# Patient Record
Sex: Female | Born: 1975 | Race: White | Hispanic: No | Smoking: Never smoker
Health system: Southern US, Community
[De-identification: ages and names within clinical notes are randomized; demographics above are authoritative.]

## PROBLEM LIST (undated history)

## (undated) DIAGNOSIS — R112 Nausea with vomiting, unspecified: Secondary | ICD-10-CM

## (undated) DIAGNOSIS — K589 Irritable bowel syndrome without diarrhea: Secondary | ICD-10-CM

## (undated) DIAGNOSIS — K219 Gastro-esophageal reflux disease without esophagitis: Secondary | ICD-10-CM

## (undated) DIAGNOSIS — R519 Headache, unspecified: Secondary | ICD-10-CM

## (undated) DIAGNOSIS — Z9889 Other specified postprocedural states: Secondary | ICD-10-CM

## (undated) DIAGNOSIS — R0789 Other chest pain: Secondary | ICD-10-CM

## (undated) DIAGNOSIS — R002 Palpitations: Secondary | ICD-10-CM

## (undated) DIAGNOSIS — E0590 Thyrotoxicosis, unspecified without thyrotoxic crisis or storm: Secondary | ICD-10-CM

## (undated) DIAGNOSIS — D6851 Activated protein C resistance: Secondary | ICD-10-CM

## (undated) DIAGNOSIS — L719 Rosacea, unspecified: Secondary | ICD-10-CM

## (undated) DIAGNOSIS — D219 Benign neoplasm of connective and other soft tissue, unspecified: Secondary | ICD-10-CM

## (undated) DIAGNOSIS — R51 Headache: Secondary | ICD-10-CM

## (undated) HISTORY — PX: WISDOM TOOTH EXTRACTION: SHX21

## (undated) HISTORY — PX: CHOLECYSTECTOMY: SHX55

## (undated) HISTORY — DX: Palpitations: R00.2

## (undated) HISTORY — DX: Other chest pain: R07.89

---

## 2011-12-23 HISTORY — PX: OTHER SURGICAL HISTORY: SHX169

## 2012-07-28 DIAGNOSIS — J019 Acute sinusitis, unspecified: Secondary | ICD-10-CM | POA: Insufficient documentation

## 2012-07-28 DIAGNOSIS — H6120 Impacted cerumen, unspecified ear: Secondary | ICD-10-CM | POA: Insufficient documentation

## 2012-07-28 DIAGNOSIS — H6990 Unspecified Eustachian tube disorder, unspecified ear: Secondary | ICD-10-CM | POA: Insufficient documentation

## 2012-11-14 DIAGNOSIS — E042 Nontoxic multinodular goiter: Secondary | ICD-10-CM | POA: Insufficient documentation

## 2013-02-05 ENCOUNTER — Other Ambulatory Visit: Payer: Self-pay | Admitting: Endocrinology

## 2013-02-05 DIAGNOSIS — E041 Nontoxic single thyroid nodule: Secondary | ICD-10-CM

## 2013-02-20 ENCOUNTER — Inpatient Hospital Stay
Admission: RE | Admit: 2013-02-20 | Discharge: 2013-02-20 | Disposition: A | Discharge status: Home / Self Care | Payer: Self-pay | Source: Ambulatory Visit | Attending: Endocrinology | Admitting: Endocrinology

## 2013-02-20 ENCOUNTER — Ambulatory Visit
Admission: RE | Admit: 2013-02-20 | Discharge: 2013-02-20 | Disposition: A | Discharge status: Home / Self Care | Payer: BC Managed Care – PPO | Source: Ambulatory Visit | Attending: Endocrinology | Admitting: Endocrinology

## 2013-02-20 ENCOUNTER — Other Ambulatory Visit (HOSPITAL_COMMUNITY)
Admission: RE | Admit: 2013-02-20 | Discharge: 2013-02-20 | Disposition: A | Discharge status: Home / Self Care | Payer: BC Managed Care – PPO | Source: Ambulatory Visit | Attending: Interventional Radiology | Admitting: Interventional Radiology

## 2013-02-20 DIAGNOSIS — E041 Nontoxic single thyroid nodule: Secondary | ICD-10-CM

## 2013-07-02 DIAGNOSIS — R109 Unspecified abdominal pain: Secondary | ICD-10-CM | POA: Insufficient documentation

## 2013-12-24 DIAGNOSIS — R0981 Nasal congestion: Secondary | ICD-10-CM | POA: Insufficient documentation

## 2013-12-24 DIAGNOSIS — N912 Amenorrhea, unspecified: Secondary | ICD-10-CM | POA: Insufficient documentation

## 2014-02-04 DIAGNOSIS — F5101 Primary insomnia: Secondary | ICD-10-CM | POA: Insufficient documentation

## 2014-02-04 DIAGNOSIS — F419 Anxiety disorder, unspecified: Secondary | ICD-10-CM | POA: Insufficient documentation

## 2014-02-04 DIAGNOSIS — G4700 Insomnia, unspecified: Secondary | ICD-10-CM | POA: Insufficient documentation

## 2014-08-20 DIAGNOSIS — R1012 Left upper quadrant pain: Secondary | ICD-10-CM | POA: Insufficient documentation

## 2014-08-20 DIAGNOSIS — K580 Irritable bowel syndrome with diarrhea: Secondary | ICD-10-CM | POA: Insufficient documentation

## 2014-08-20 DIAGNOSIS — K5900 Constipation, unspecified: Secondary | ICD-10-CM | POA: Insufficient documentation

## 2015-03-12 DIAGNOSIS — R1084 Generalized abdominal pain: Secondary | ICD-10-CM | POA: Insufficient documentation

## 2015-03-12 DIAGNOSIS — R1031 Right lower quadrant pain: Secondary | ICD-10-CM | POA: Insufficient documentation

## 2015-07-26 DIAGNOSIS — G8929 Other chronic pain: Secondary | ICD-10-CM | POA: Insufficient documentation

## 2015-08-06 DIAGNOSIS — N921 Excessive and frequent menstruation with irregular cycle: Secondary | ICD-10-CM | POA: Insufficient documentation

## 2015-08-13 DIAGNOSIS — D6851 Activated protein C resistance: Secondary | ICD-10-CM | POA: Insufficient documentation

## 2016-02-25 DIAGNOSIS — K219 Gastro-esophageal reflux disease without esophagitis: Secondary | ICD-10-CM | POA: Insufficient documentation

## 2016-03-02 DIAGNOSIS — K824 Cholesterolosis of gallbladder: Secondary | ICD-10-CM | POA: Insufficient documentation

## 2016-03-04 DIAGNOSIS — F411 Generalized anxiety disorder: Secondary | ICD-10-CM | POA: Insufficient documentation

## 2016-04-25 DIAGNOSIS — Z823 Family history of stroke: Secondary | ICD-10-CM | POA: Insufficient documentation

## 2016-07-01 DIAGNOSIS — R202 Paresthesia of skin: Secondary | ICD-10-CM | POA: Insufficient documentation

## 2016-08-21 HISTORY — PX: OTHER SURGICAL HISTORY: SHX169

## 2016-08-25 ENCOUNTER — Other Ambulatory Visit: Payer: Self-pay | Admitting: Obstetrics and Gynecology

## 2016-08-31 DIAGNOSIS — G43009 Migraine without aura, not intractable, without status migrainosus: Secondary | ICD-10-CM | POA: Insufficient documentation

## 2016-08-31 DIAGNOSIS — G43909 Migraine, unspecified, not intractable, without status migrainosus: Secondary | ICD-10-CM | POA: Insufficient documentation

## 2016-08-31 NOTE — H&P (Signed)
Alexandra Hinton is a 41 y.o.  female P: 0-0-1-0 who presents for myomectomy because of symptomatic uterine fibroids.  The patient underwent a myomectomy in 2013 with no fibroid related symptoms following until 2016 when her symptoms returned.  She reports a 5 day menstrual flow that requires the change of a 3 pads every 20 minutes to 1 pad every hour..  As her period tapers, she may be able to go for 2 hours without a pad change.   Additionally she has some inter-menstrual spotting, premenstural diarrhea, post coital bleeding and positional dyspareunia.  She denies any changes in urination , though she finds it difficult to hold her urine, and no dysuria or incontinence.  For 2 weeks out of each month the patient describes problems with having a bowel movement though it is not constipation. Taking stool softeners seems to help these symptoms. She will have, throughout the month, periodic right lower quadrant abdominal pain that is described as dull and achy with episodes of sharp shooting pains.  A pelvic ultrasound in May 2018 revealed: retroverted uterus: 4.93 x 5.22 x 9.17 cm,   endometrium: 1.03 cm; #5 fibroids: right inferior subserosal-2.74 cm;   right lateral inferior lower uterine segment subserosal-2.44;   anterior inferior lower unterine segment subserosal-2.65 cm; posterior left subserosal-5.70 cm and posterior fundal intramural-2.86 cm; right ovary-3.48 cm and left ovary-3.85 cm.  A review of both medical and surgical management options were given to the patient, however, she has decided to proceed with surgical management in the form of myomectomy.   Past Medical History  OB History: G: 1  P: 0-0-1-0  GYN History: menarche: 41 YO    LMP: 08/07/16    Contracepton no method  The patient denies history of sexually transmitted disease.  Has a history of abnormal PAP smear treated with Cervical Conization-normal since.  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 Amoxicillin-Pot Clavulanate Hives and Shortness Of Breath    Has patient had a PCN reaction causing immediate rash, facial/tongue/throat swelling, SOB or lightheadedness with hypotension: Yes Has patient had a PCN reaction causing severe rash involving mucus membranes or skin necrosis: Yes Has patient had a PCN reaction that required hospitalization: No Has patient had a PCN reaction occurring within the last 10 years: No If all of the above answers are "NO", then may proceed with Cephalosporin use.   . Cefaclor Anaphylaxis, Shortness Of Breath and Rash  . Sulfamethoxazole-Trimethoprim Hives and Shortness Of Breath  . Tape Rash    derma bond allergy  PATIENT TOLERATES PAPER TAPER   . Erythromycin Diarrhea and Nausea And Vomiting  . Latex Rash    Intolerant of Percocet (severe)  but able to tolerate Vicodin  Denies sensitivity to peanuts,  shellfish, soy or  latex  but has severe skin reaction to Dermabond and some adhesives   ROS: Admits to stress headaches, situational anxiety, occasional blurred vision, nausea and diarrhea with menses and urinary  frequency on occasion;  Denies corrective lenses,  nasal congestion, dysphagia, tinnitus, dizziness, hoarseness, cough,  chest pain, shortness of breath,  vomiting, constipation,   urgency  dysuria, hematuria, vaginitis symptoms, swelling of joints,easy bruising,  myalgias, arthralgias, skin rashes, unexplained weight loss and except as is mentioned in the history of present illness, patient's review of systems is otherwise negative.   Physical Exam  Bp: 100/60    P: 76 bpm   R: 16  Temperature: 99.4 degrees F orally   Weight: 156 lbs.  Height: 5'5"  BMI:  26  Neck: supple without masses or thyromegaly Lungs: clear to auscultation Heart: regular rate and rhythm Abdomen: diffusely tender and no organomegaly Pelvic:EGBUS- wnl; vagina-normal rugae; uterus-irregular retroverted 12-14 weeks size, cervix without lesions or motion tenderness; adnexae-bilateral  tenderness but no  masses Extremities:  no clubbing, cyanosis or edema   Assesment: Symptomatic Uterine Fibroids           Pelvic Pain  Disposition:  The indications of the procedure along with the risks were given to the patient, which include but are not limited to: reaction to anesthesia, damage to adjacent organs, bleeding, transient post operative facial edema, infection, need for an open abdominal incision and if the endometrium is breached, the need for a C-section delivery.  Benefits of the robotic approach include lesser postoperative pain, less blood loss during surgery, reduced risk of injury to other organs due to better visualization with a 3-D HD 10 times magnifying camera, shorter hospital stay between 0-1 night and rapid recovery with return to daily routine in 2-3 weeks. Although robotically-assisted hysterectomy has a longer operative time than traditional laparotomy, in a patient with good medical history, the benefits usually outweigh the risks. The patient was given the indications for her procedures, along with the risks,  which include: bleeding, infection, injury to other organs, need for laparotomy, transient post-operative facial edema and if the endometrium is breached, a C-sectiion delivery would be advised.    Patient informed about FDA warning on use of morcellator dated 06/07/2012. Discussed: 1. Incidence of post-operative diagnosis of sarcoma in women undergoing a hysterectomy is 2:1000  2. Annual incidence of leiomyosarcoma is 0.64/100,000 women  3. [place] have the highest incidence in women over 65  4. Power morcellation involves risks of spreading tissue / disease. In he case of undiagnosed cancer, it may adversely affect the patient's prognosis.    Patient voices understanding and desires to proceed as planned and has consented to a Robot Assisted Myomectomy with Morcellation at [place] on September 13, 2016 at 10:45 a.m.   CSN# [id number]   Maximos Zayas J. Florene Glen, PA-C  for Dr. Dede Query. Rivard

## 2016-09-01 NOTE — Patient Instructions (Addendum)
Your procedure is scheduled on:  Tuesday, 7/24  Enter through the Main Entrance of [facility] at: 9 am  Pick up the phone at the desk and dial [phone].  Call this number if you have problems the morning of surgery: [phone].  Remember: Do NOT eat food or drink clear liquids (including water) after:  Midnight Monday  Take these medicines the morning of surgery with a SIP OF WATER:  None  Stop herbal medications and supplements at this time.  Do NOT wear jewelry (body piercing), metal hair clips/bobby pins, make-up, or nail polish. Do NOT wear lotions, powders, or perfumes.  You may wear deoderant. Do NOT shave for 48 hours prior to surgery. Do NOT bring valuables to the hospital.   Leave suitcase in car.  After surgery it may be brought to your room.  For patients admitted to the hospital, checkout time is 11:00 AM the day of discharge. Have a responsible adult drive you home and stay with you for 24 hours after your procedure.  Home with husband Alexandra Hinton cell [phone].

## 2016-09-02 ENCOUNTER — Encounter (HOSPITAL_COMMUNITY): Payer: Self-pay

## 2016-09-02 ENCOUNTER — Encounter (HOSPITAL_COMMUNITY)
Admission: RE | Admit: 2016-09-02 | Discharge: 2016-09-02 | Disposition: A | Discharge status: Home / Self Care | Payer: BLUE CROSS/BLUE SHIELD | Source: Ambulatory Visit | Attending: Obstetrics and Gynecology | Admitting: Obstetrics and Gynecology

## 2016-09-02 DIAGNOSIS — D259 Leiomyoma of uterus, unspecified: Secondary | ICD-10-CM | POA: Diagnosis not present

## 2016-09-02 DIAGNOSIS — Z01812 Encounter for preprocedural laboratory examination: Secondary | ICD-10-CM | POA: Insufficient documentation

## 2016-09-02 HISTORY — DX: Headache, unspecified: R51.9

## 2016-09-02 HISTORY — DX: Rosacea, unspecified: L71.9

## 2016-09-02 HISTORY — DX: Other specified postprocedural states: Z98.890

## 2016-09-02 HISTORY — DX: Nausea with vomiting, unspecified: R11.2

## 2016-09-02 HISTORY — DX: Benign neoplasm of connective and other soft tissue, unspecified: D21.9

## 2016-09-02 HISTORY — DX: Irritable bowel syndrome, unspecified: K58.9

## 2016-09-02 HISTORY — DX: Headache: R51

## 2016-09-02 LAB — ABO/RH: ABO/RH(D): O POS

## 2016-09-02 LAB — CBC
HCT: 42.4 % (ref 36.0–46.0)
Hemoglobin: 14.5 g/dL (ref 12.0–15.0)
MCH: 32.1 pg (ref 26.0–34.0)
MCHC: 34.2 g/dL (ref 30.0–36.0)
MCV: 93.8 fL (ref 78.0–100.0)
PLATELETS: 269 10*3/uL (ref 150–400)
RBC: 4.52 MIL/uL (ref 3.87–5.11)
RDW: 13 % (ref 11.5–15.5)
WBC: 8.5 10*3/uL (ref 4.0–10.5)

## 2016-09-02 LAB — BASIC METABOLIC PANEL
Anion gap: 5 (ref 5–15)
BUN: 12 mg/dL (ref 6–20)
CALCIUM: 9.5 mg/dL (ref 8.9–10.3)
CHLORIDE: 103 mmol/L (ref 101–111)
CO2: 30 mmol/L (ref 22–32)
CREATININE: 0.87 mg/dL (ref 0.44–1.00)
Glucose, Bld: 88 mg/dL (ref 65–99)
Potassium: 4 mmol/L (ref 3.5–5.1)
SODIUM: 138 mmol/L (ref 135–145)

## 2016-09-02 LAB — TYPE AND SCREEN
ABO/RH(D): O POS
ANTIBODY SCREEN: NEGATIVE

## 2016-09-13 ENCOUNTER — Ambulatory Visit (HOSPITAL_COMMUNITY)
Admission: RE | Admit: 2016-09-13 | Discharge: 2016-09-13 | Disposition: A | Discharge status: Home / Self Care | Payer: BLUE CROSS/BLUE SHIELD | Source: Ambulatory Visit | Attending: Obstetrics and Gynecology | Admitting: Obstetrics and Gynecology

## 2016-09-13 ENCOUNTER — Encounter (HOSPITAL_COMMUNITY): Payer: Self-pay | Admitting: *Deleted

## 2016-09-13 ENCOUNTER — Ambulatory Visit (HOSPITAL_COMMUNITY): Payer: BLUE CROSS/BLUE SHIELD | Admitting: Anesthesiology

## 2016-09-13 ENCOUNTER — Encounter (HOSPITAL_COMMUNITY)
Admission: RE | Disposition: A | Discharge status: Home / Self Care | Payer: Self-pay | Source: Ambulatory Visit | Attending: Obstetrics and Gynecology

## 2016-09-13 DIAGNOSIS — D252 Subserosal leiomyoma of uterus: Secondary | ICD-10-CM | POA: Diagnosis not present

## 2016-09-13 DIAGNOSIS — Z7982 Long term (current) use of aspirin: Secondary | ICD-10-CM | POA: Diagnosis not present

## 2016-09-13 DIAGNOSIS — Z79899 Other long term (current) drug therapy: Secondary | ICD-10-CM | POA: Insufficient documentation

## 2016-09-13 DIAGNOSIS — N736 Female pelvic peritoneal adhesions (postinfective): Secondary | ICD-10-CM | POA: Insufficient documentation

## 2016-09-13 DIAGNOSIS — D251 Intramural leiomyoma of uterus: Secondary | ICD-10-CM | POA: Insufficient documentation

## 2016-09-13 DIAGNOSIS — N808 Other endometriosis: Secondary | ICD-10-CM | POA: Diagnosis not present

## 2016-09-13 DIAGNOSIS — N803 Endometriosis of pelvic peritoneum: Secondary | ICD-10-CM | POA: Insufficient documentation

## 2016-09-13 DIAGNOSIS — D259 Leiomyoma of uterus, unspecified: Secondary | ICD-10-CM | POA: Diagnosis present

## 2016-09-13 HISTORY — PX: LYSIS OF ADHESION: SHX5961

## 2016-09-13 HISTORY — PX: ROBOT ASSISTED MYOMECTOMY: SHX5142

## 2016-09-13 LAB — PREGNANCY, URINE: PREG TEST UR: NEGATIVE

## 2016-09-13 SURGERY — ROBOTIC ASSISTED MYOMECTOMY
Anesthesia: General | Site: Abdomen

## 2016-09-13 MED ORDER — PROPOFOL 10 MG/ML IV BOLUS
INTRAVENOUS | Status: DC | PRN
Start: 2016-09-13 — End: 2016-09-13
  Administered 2016-09-13: 150 mg via INTRAVENOUS

## 2016-09-13 MED ORDER — IBUPROFEN 600 MG PO TABS: ORAL_TABLET | ORAL | 1 refills | Status: DC

## 2016-09-13 MED ORDER — VASOPRESSIN 20 UNIT/ML IV SOLN
INTRAVENOUS | Status: DC | PRN
  Administered 2016-09-13: 48 mL via INTRAMUSCULAR

## 2016-09-13 MED ORDER — HYDROCODONE-ACETAMINOPHEN 5-325 MG PO TABS: 1.0000 | ORAL_TABLET | Freq: Four times a day (QID) | ORAL | Status: DC | PRN

## 2016-09-13 MED ORDER — KETOROLAC TROMETHAMINE 30 MG/ML IJ SOLN
INTRAMUSCULAR | Status: AC
  Filled 2016-09-13: qty 1

## 2016-09-13 MED ORDER — LIDOCAINE HCL (CARDIAC) 20 MG/ML IV SOLN
INTRAVENOUS | Status: AC
  Filled 2016-09-13: qty 5

## 2016-09-13 MED ORDER — SODIUM CHLORIDE 0.9 % IV SOLN
INTRAVENOUS | Status: DC | PRN
  Administered 2016-09-13: 113 mL
  Administered 2016-09-13: 10 mL

## 2016-09-13 MED ORDER — PROPOFOL 10 MG/ML IV BOLUS
INTRAVENOUS | Status: AC
  Filled 2016-09-13: qty 20

## 2016-09-13 MED ORDER — DEXAMETHASONE SODIUM PHOSPHATE 4 MG/ML IJ SOLN
INTRAMUSCULAR | Status: AC
  Filled 2016-09-13: qty 1

## 2016-09-13 MED ORDER — HYDROMORPHONE HCL 1 MG/ML IJ SOLN: 0.2500 mg | INTRAMUSCULAR | Status: DC | PRN

## 2016-09-13 MED ORDER — SODIUM CHLORIDE 0.9 % IR SOLN
Status: DC | PRN
  Administered 2016-09-13: 3000 mL

## 2016-09-13 MED ORDER — ONDANSETRON HCL 4 MG/2ML IJ SOLN
INTRAMUSCULAR | Status: AC
  Filled 2016-09-13: qty 2

## 2016-09-13 MED ORDER — ONDANSETRON HCL 4 MG/2ML IJ SOLN
INTRAMUSCULAR | Status: DC | PRN
  Administered 2016-09-13: 4 mg via INTRAVENOUS

## 2016-09-13 MED ORDER — SCOPOLAMINE 1 MG/3DAYS TD PT72
1.0000 | MEDICATED_PATCH | Freq: Once | TRANSDERMAL | Status: DC
  Administered 2016-09-13: 1.5 mg via TRANSDERMAL

## 2016-09-13 MED ORDER — DEXAMETHASONE SODIUM PHOSPHATE 10 MG/ML IJ SOLN
INTRAMUSCULAR | Status: DC | PRN
  Administered 2016-09-13: 10 mg via INTRAVENOUS

## 2016-09-13 MED ORDER — KETOROLAC TROMETHAMINE 30 MG/ML IJ SOLN: 30.0000 mg | Freq: Once | INTRAMUSCULAR | Status: DC | PRN

## 2016-09-13 MED ORDER — SCOPOLAMINE 1 MG/3DAYS TD PT72
MEDICATED_PATCH | TRANSDERMAL | Status: AC
  Administered 2016-09-13: 1.5 mg via TRANSDERMAL
  Filled 2016-09-13: qty 1

## 2016-09-13 MED ORDER — CEFOTETAN DISODIUM-DEXTROSE 2-2.08 GM-% IV SOLR
2.0000 g | INTRAVENOUS | Status: AC
  Administered 2016-09-13: 2 g via INTRAVENOUS

## 2016-09-13 MED ORDER — IBUPROFEN 600 MG PO TABS: 600.0000 mg | ORAL_TABLET | Freq: Four times a day (QID) | ORAL | 0 refills | Status: DC | PRN

## 2016-09-13 MED ORDER — SODIUM CHLORIDE 0.9 % IJ SOLN
INTRAMUSCULAR | Status: AC
  Filled 2016-09-13: qty 200

## 2016-09-13 MED ORDER — VASOPRESSIN 20 UNIT/ML IV SOLN
INTRAVENOUS | Status: AC
  Filled 2016-09-13: qty 1

## 2016-09-13 MED ORDER — IBUPROFEN 600 MG PO TABS: 600.0000 mg | ORAL_TABLET | Freq: Four times a day (QID) | ORAL | Status: DC | PRN

## 2016-09-13 MED ORDER — ROCURONIUM BROMIDE 100 MG/10ML IV SOLN
INTRAVENOUS | Status: DC | PRN
  Administered 2016-09-13: 20 mg via INTRAVENOUS
  Administered 2016-09-13 (×2): 10 mg via INTRAVENOUS
  Administered 2016-09-13: 50 mg via INTRAVENOUS
  Administered 2016-09-13: 10 mg via INTRAVENOUS

## 2016-09-13 MED ORDER — MIDAZOLAM HCL 2 MG/2ML IJ SOLN
INTRAMUSCULAR | Status: AC
  Filled 2016-09-13: qty 2

## 2016-09-13 MED ORDER — ROCURONIUM BROMIDE 100 MG/10ML IV SOLN
INTRAVENOUS | Status: AC
  Filled 2016-09-13: qty 1

## 2016-09-13 MED ORDER — MIDAZOLAM HCL 2 MG/2ML IJ SOLN
INTRAMUSCULAR | Status: DC | PRN
Start: 2016-09-13 — End: 2016-09-13
  Administered 2016-09-13: 2 mg via INTRAVENOUS

## 2016-09-13 MED ORDER — LACTATED RINGERS IV SOLN
INTRAVENOUS | Status: DC
  Administered 2016-09-13 (×2): via INTRAVENOUS

## 2016-09-13 MED ORDER — PROMETHAZINE HCL 25 MG/ML IJ SOLN: 6.2500 mg | INTRAMUSCULAR | Status: DC | PRN

## 2016-09-13 MED ORDER — LACTATED RINGERS IV SOLN
INTRAVENOUS | Status: DC
Start: 2016-09-13 — End: 2016-09-13

## 2016-09-13 MED ORDER — ROPIVACAINE HCL 5 MG/ML IJ SOLN
INTRAMUSCULAR | Status: AC
  Filled 2016-09-13: qty 60

## 2016-09-13 MED ORDER — HYDROMORPHONE HCL 1 MG/ML IJ SOLN
INTRAMUSCULAR | Status: AC
  Filled 2016-09-13: qty 1

## 2016-09-13 MED ORDER — KETOROLAC TROMETHAMINE 30 MG/ML IJ SOLN
INTRAMUSCULAR | Status: DC | PRN
  Administered 2016-09-13: 30 mg via INTRAVENOUS
  Administered 2016-09-13: 30 mg via INTRAMUSCULAR

## 2016-09-13 MED ORDER — SUGAMMADEX SODIUM 200 MG/2ML IV SOLN
INTRAVENOUS | Status: DC | PRN
  Administered 2016-09-13: 141.6 mg via INTRAVENOUS

## 2016-09-13 MED ORDER — HYDROCODONE-ACETAMINOPHEN 5-325 MG PO TABS: ORAL_TABLET | ORAL | 0 refills | Status: DC

## 2016-09-13 MED ORDER — FENTANYL CITRATE (PF) 100 MCG/2ML IJ SOLN
INTRAMUSCULAR | Status: DC | PRN
  Administered 2016-09-13: 50 ug via INTRAVENOUS
  Administered 2016-09-13: 100 ug via INTRAVENOUS
  Administered 2016-09-13 (×2): 50 ug via INTRAVENOUS

## 2016-09-13 MED ORDER — FENTANYL CITRATE (PF) 250 MCG/5ML IJ SOLN
INTRAMUSCULAR | Status: AC
  Filled 2016-09-13: qty 5

## 2016-09-13 MED ORDER — ONDANSETRON HCL 4 MG PO TABS: 4.0000 mg | ORAL_TABLET | Freq: Three times a day (TID) | ORAL | Status: DC | PRN

## 2016-09-13 MED ORDER — GLYCOPYRROLATE 0.2 MG/ML IJ SOLN
INTRAMUSCULAR | Status: DC | PRN
  Administered 2016-09-13: 0.1 mg via INTRAVENOUS

## 2016-09-13 MED ORDER — LIDOCAINE HCL (CARDIAC) 20 MG/ML IV SOLN
INTRAVENOUS | Status: DC | PRN
  Administered 2016-09-13: 80 mg via INTRAVENOUS

## 2016-09-13 MED ORDER — CEFOTETAN DISODIUM-DEXTROSE 2-2.08 GM-% IV SOLR
INTRAVENOUS | Status: AC
  Filled 2016-09-13: qty 50

## 2016-09-13 MED ORDER — MENTHOL 3 MG MT LOZG: 1.0000 | LOZENGE | OROMUCOSAL | Status: DC | PRN

## 2016-09-13 SURGICAL SUPPLY — 69 items
BARRIER ADHS 3X4 INTERCEED (GAUZE/BANDAGES/DRESSINGS) ×3 IMPLANT
BLADE LAP MORCELLATOR 15MMX9.5 (ELECTROSURGICAL) ×1
BLADE LAP MORCELLATOR 15X9.5 (ELECTROSURGICAL) ×2 IMPLANT
BLADE MORCELLATOR EXT  12.5X15 (ELECTROSURGICAL)
BLADE MORCELLATOR EXT 12.5X15 (ELECTROSURGICAL) IMPLANT
CATH SILICONE 16FRX5CC (CATHETERS) ×3 IMPLANT
CLOTH BEACON ORANGE TIMEOUT ST (SAFETY) ×3 IMPLANT
CONT PATH 16OZ SNAP LID 3702 (MISCELLANEOUS) ×3 IMPLANT
COVER BACK TABLE 60X90IN (DRAPES) ×6 IMPLANT
COVER TIP SHEARS 8 DVNC (MISCELLANEOUS) ×1 IMPLANT
COVER TIP SHEARS 8MM DA VINCI (MISCELLANEOUS) ×2
DECANTER SPIKE VIAL GLASS SM (MISCELLANEOUS) ×12 IMPLANT
DEFOGGER SCOPE WARMER CLEARIFY (MISCELLANEOUS) ×3 IMPLANT
DERMABOND ADVANCED (GAUZE/BANDAGES/DRESSINGS)
DERMABOND ADVANCED .7 DNX12 (GAUZE/BANDAGES/DRESSINGS) IMPLANT
DRSG OPSITE POSTOP 3X4 (GAUZE/BANDAGES/DRESSINGS) ×3 IMPLANT
DURAPREP 26ML APPLICATOR (WOUND CARE) ×3 IMPLANT
ELECT REM PT RETURN 9FT ADLT (ELECTROSURGICAL) ×3
ELECTRODE REM PT RTRN 9FT ADLT (ELECTROSURGICAL) ×1 IMPLANT
GAUZE VASELINE 3X9 (GAUZE/BANDAGES/DRESSINGS) IMPLANT
GLOVE BIOGEL PI IND STRL 7.0 (GLOVE) ×5 IMPLANT
GLOVE BIOGEL PI INDICATOR 7.0 (GLOVE) ×10
GLOVE ECLIPSE 6.5 STRL STRAW (GLOVE) ×9 IMPLANT
KIT ABG SYR 3ML LUER SLIP (SYRINGE) ×3 IMPLANT
KIT ACCESSORY DA VINCI DISP (KITS) ×2
KIT ACCESSORY DVNC DISP (KITS) ×1 IMPLANT
LEGGING LITHOTOMY PAIR STRL (DRAPES) ×3 IMPLANT
MANIPULATOR UTERINE 4.5 ZUMI (MISCELLANEOUS) IMPLANT
NEEDLE SPNL 22GX7 QUINCKE BK (NEEDLE) ×3 IMPLANT
NS IRRIG 1000ML POUR BTL (IV SOLUTION) ×3 IMPLANT
PACK ROBOT WH (CUSTOM PROCEDURE TRAY) ×3 IMPLANT
PACK ROBOTIC GOWN (GOWN DISPOSABLE) ×3 IMPLANT
PACK TRENDGUARD 450 HYBRID PRO (MISCELLANEOUS) ×1 IMPLANT
PACK TRENDGUARD 600 HYBRD PROC (MISCELLANEOUS) IMPLANT
PAD PREP 24X48 CUFFED NSTRL (MISCELLANEOUS) ×3 IMPLANT
PORT ACCESS TROCAR AIRSEAL 12 (TROCAR) ×1 IMPLANT
PORT ACCESS TROCAR AIRSEAL 5M (TROCAR) ×2
POUCH LAPAROSCOPIC INSTRUMENT (MISCELLANEOUS) ×3 IMPLANT
PROTECTOR NERVE ULNAR (MISCELLANEOUS) ×9 IMPLANT
SET CYSTO W/LG BORE CLAMP LF (SET/KITS/TRAYS/PACK) IMPLANT
SET IRRIG TUBING LAPAROSCOPIC (IRRIGATION / IRRIGATOR) ×3 IMPLANT
SET TRI-LUMEN FLTR TB AIRSEAL (TUBING) ×3 IMPLANT
SUT DVC VLOC 180 0 12IN GS21 (SUTURE) ×6
SUT DVC VLOC 180 2-0 12IN GS21 (SUTURE) ×3
SUT MNCRL AB 3-0 PS2 27 (SUTURE) ×6 IMPLANT
SUT VIC AB 0 CT2 27 (SUTURE) IMPLANT
SUT VICRYL 0 UR6 27IN ABS (SUTURE) ×6 IMPLANT
SUT VLOC 180 0 9IN  GS21 (SUTURE)
SUT VLOC 180 0 9IN GS21 (SUTURE) IMPLANT
SUT VLOC 180 2-0 6IN GS21 (SUTURE) ×9 IMPLANT
SUT VLOC 180 2-0 9IN GS21 (SUTURE) ×3 IMPLANT
SUTURE DVC VL 180 2-0 12INGS21 (SUTURE) ×1 IMPLANT
SUTURE DVC VLC 180 0 12IN GS21 (SUTURE) ×2 IMPLANT
SYR 50ML LL SCALE MARK (SYRINGE) ×3 IMPLANT
SYR BULB IRRIGATION 50ML (SYRINGE) ×3 IMPLANT
SYRINGE 10CC LL (SYRINGE) ×6 IMPLANT
SYSTEM CONVERTIBLE TROCAR (TROCAR) ×3 IMPLANT
TIP UTERINE 5.1X6CM LAV DISP (MISCELLANEOUS) IMPLANT
TIP UTERINE 6.7X10CM GRN DISP (MISCELLANEOUS) IMPLANT
TIP UTERINE 6.7X6CM WHT DISP (MISCELLANEOUS) IMPLANT
TIP UTERINE 6.7X8CM BLUE DISP (MISCELLANEOUS) ×3 IMPLANT
TOWEL OR 17X24 6PK STRL BLUE (TOWEL DISPOSABLE) ×9 IMPLANT
TRAY FOLEY CATH SILVER 16FR (SET/KITS/TRAYS/PACK) IMPLANT
TRENDGUARD 450 HYBRID PRO PACK (MISCELLANEOUS) ×3
TRENDGUARD 600 HYBRID PROC PK (MISCELLANEOUS)
TROCAR 12M 150ML BLUNT (TROCAR) ×3 IMPLANT
TROCAR DISP BLADELESS 8 DVNC (TROCAR) ×1 IMPLANT
TROCAR DISP BLADELESS 8MM (TROCAR) ×2
TROCAR PORT AIRSEAL 5X120 (TROCAR) ×3 IMPLANT

## 2016-09-13 NOTE — Interval H&P Note (Signed)
History and Physical Interval Note:  09/13/2016 9:48 AM  Alexandra Hinton  has presented today for surgery, with the diagnosis of Uterine Fibroids and Pelvic Pain  The various methods of treatment have been discussed with the patient and family. After consideration of risks, benefits and other options for treatment, the patient has consented to  Procedure(s) with comments: ROBOTIC ASSISTED MYOMECTOMY with Morcellation (Left) - power morcellation as a surgical intervention .  The patient's history has been reviewed, patient examined, no change in status, stable for surgery.  I have reviewed the patient's chart and labs.  Questions were answered to the patient's satisfaction.     Chardonay Scritchfield A

## 2016-09-13 NOTE — Progress Notes (Signed)
Patient discharged home with significant other.  Discharge instructions reviewed, questions asked and answered.  Denies any pain at this time.  Voiding without difficulty.  To car via wheel chair with Lenise Herald, NT

## 2016-09-13 NOTE — Op Note (Addendum)
Preoperative diagnosis: Symptomatic uterine fibroids   Postoperative diagnosis: Same, pelvic adhesions and endometriosis  Anesthesia: General   Anesthesiologist: Dr. Kalman Shan  Procedure: Robotically assisted myomectomy, lysis of adhesions and cauterization of endometriosis   Surgeon: Dr. Katharine Look Sheneka Schrom   Assistant: Earnstine Regal P.A.-C .  Estimated blood loss: 50 cc   Procedure:   After being informed of the planned procedure with possible complications including but not limited to bleeding, infection, injury to other organs, need for laparotomy, possible need for morcellation with risks and benefits reviewed, expected hospital stay and recovery, informed consent is obtained and patient is taken to or #7. She is placed in lithotomy position with both arms padded and tucked on each side and bilateral knee-high sequential compressive devices. She is given general anesthesia with endotracheal intubation without any complication. She is prepped and draped in a sterile fashion. A three-way Foley catheter is inserted in her bladder.   Pelvic exam reveals: anteverted uterus, enlarged to 14 weeks with a dominant posterior fibroid. Adnexas are unremarkable  A weighted speculum is inserted in the vagina and the anterior lip of the cervix is grasped with a tenaculum forcep. We proceed with a paracervical block and vaginal infiltration using ropivacaine 0.5% diluted 1 in 1 with saline. The uterus was then sounded at 9 cm. We easily dilate the cervix using Hegar dilator to #27 which allows for easy placement of the intrauterine RUMI manipulator with a 3.5 KOH ring.   Trocar placement is decided. We infiltrate 3 cm above the umbilicus with 10 cc of ropivacaine per protocol and perform a 10 mm transverse  incision which is brought down bluntly to the fascia. The fascia is identified and grasped with Coker forceps. The fascia is incised with Mayo scissors. Peritoneum is entered bluntly. A pursestring suture of 0  Vicryl is placed on the fascia and a 10 mm Hassan trocar is easily inserted in the abdominal cavity held in placed with a Purstring suture. This allows for easy insufflation of a pneumoperitoneum using warmed CO2 at a maximum pressure of 15 mm of mercury. 60 cc of Ropivacaine 0.5 % diluted 1 in 1 is sent in the pelvis and the patient is positioned in reverse Trendelenburg. We then placed two 59mm robotic trocar on the left, one 31mm robotic trocar on the right and one 10 mm patient's side assistant trocar on the right after infiltrating every site with ropivacaine per protocol.Trocar sites utilize all previous incisions. The robot is docked on the left of the patient after positioning her in Trendelenburg. A monopolar scissor is inserted in arm #1, a PK gyrus forcep is inserted in arm #2. We opt not to use the 3rd arm for now. Preparation and docking is completed in 63 minutes.   Observation: The uterus is enlarged with multiple fibroids as follows:  #1 A dominant posterior fibroid measuring 6 cm, #2 posterior right measuring 3 cm, #3 fundal pedunculated measuring 2 cm, #4 fundal intramural measuring 0.5 cm, #5 right mesosalpynx measuring 3 cm, #6 subserosal posterior measuring 0.5 cm, #7 pedunculated left posterior cornual measuring 1 cm, #8 anterior subserosal measuring 3.5 cm, #9 anterior subserosal measuring 2.5 cm, #10 pedunculated fundal 0.5 cm and #9 right broad ligament measuring 5 cm.   Both tubes and ovaries are normal. Anterior cul-de-sac are normal. Posterior cul-de-sac has scattered multiple brown and blue lesions of endometriosis mainly between the 2 uterosacral ligaments and on the posterior cervix. Liver is visualized and normal. Appendix  Visualized and normal. We  small adhesions between the sigmoid colon and the left pelvic wall.   We sharply divide adhesions to free the sigmoid colon.   We proceed by infiltrating all the previously described fibroids using vasopressin at a dilution of 20  units in 100 cc of saline until we obtain complete blanching. Using monopolar scissors, the serosa of each fibroid is incised. The fibroid is grasped with Tenaculum. Using sharp and blunt dissection, the fibroid is extracted from the myometrium. All fibroids are deposited in the posterior cul-de-sac for future removal.  The myometrial defects are repaired in multiple layers of running sutures of 0 V-Lock. All serosal defects are closed with a baseball suture of 2-0 V-Lock.  Pelvic endometriosis is cauterized using bipolar energy except for implants on the right ureter.  We irrigated profusely to confirm a satisfactory hemostasis. A sheet of Interceed covers all incisions on the uterus. Consult time is completed in 2 hours 11 minutes  All instruments are then removed and the robot is undocked.   The patient side assistant 10 mm trocar is replaced with the morcellator under direct visualization. We then proceed with systematic morcellation of the specimen which takes 11 minutes.   We irrigated profusely to again confirm a satisfactory hemostasis.   All trochars are removed under direct visualization after evacuating the pneumoperitoneum.   The fascia of the supraumbilical incision is closed with the previously placed pursestring suture of 0 Vicryl. the fascia of the right patient side assistant trocar is closed with a figure-of-eight stitch of 0 Vicryl. All incisions are then closed with subcuticular suture of 3-0 Monocryl.  A speculum is inserted in the vagina to confirm adequate hemostasis on the cervix.   Instrument and sponge count is complete x2. Estimated blood loss is 50 cc.   The procedure is well tolerated by the patient who  is taken to recovery room in a well and stable condition.   NUMBER OF FIBROIDS REMOVED: 11  NUMBER OF INCISIONS: 4 UTERINE, 1 RIGHT BROAD LIGAMENT AND 1 RIGHT MESOSALPYNX  ENDOMETRIAL CAVITY ENTERED: no  CESAREAN DELIVERY RECOMMENDED: no  Specimen:  Fibroids weighing 104 g sent to pathology.

## 2016-09-13 NOTE — Anesthesia Procedure Notes (Signed)
Procedure Name: Intubation Date/Time: 09/13/2016 10:40 AM Performed by: Hewitt Blade Pre-anesthesia Checklist: Patient identified, Emergency Drugs available, Suction available and Patient being monitored Patient Re-evaluated:Patient Re-evaluated prior to induction Oxygen Delivery Method: Circle system utilized Preoxygenation: Pre-oxygenation with 100% oxygen Induction Type: IV induction Ventilation: Mask ventilation without difficulty Laryngoscope Size: Mac and 3 Grade View: Grade I Tube type: Oral Tube size: 7.0 mm Number of attempts: 1 Airway Equipment and Method: Stylet Placement Confirmation: ETT inserted through vocal cords under direct vision,  positive ETCO2 and breath sounds checked- equal and bilateral Secured at: 21 cm Tube secured with: Tape Dental Injury: Teeth and Oropharynx as per pre-operative assessment

## 2016-09-13 NOTE — Anesthesia Preprocedure Evaluation (Signed)
Anesthesia Evaluation  Patient identified by MRN, date of birth, ID band Patient awake    Reviewed: Allergy & Precautions, NPO status , Patient's Chart, lab work & pertinent test results  History of Anesthesia Complications (+) PONV  Airway Mallampati: II  TM Distance: >3 FB Neck ROM: Full    Dental no notable dental hx.    Pulmonary neg pulmonary ROS,    Pulmonary exam normal breath sounds clear to auscultation       Cardiovascular negative cardio ROS Normal cardiovascular exam Rhythm:Regular Rate:Normal     Neuro/Psych negative neurological ROS  negative psych ROS   GI/Hepatic negative GI ROS, Neg liver ROS,   Endo/Other  negative endocrine ROS  Renal/GU negative Renal ROS  negative genitourinary   Musculoskeletal negative musculoskeletal ROS (+)   Abdominal   Peds negative pediatric ROS (+)  Hematology negative hematology ROS (+)   Anesthesia Other Findings   Reproductive/Obstetrics negative OB ROS                             Anesthesia Physical Anesthesia Plan  ASA: I  Anesthesia Plan: General   Post-op Pain Management:    Induction: Intravenous  PONV Risk Score and Plan: 2 and Ondansetron, Dexamethasone, Propofol and Scopolamine patch - Pre-op  Airway Management Planned: Oral ETT  Additional Equipment:   Intra-op Plan:   Post-operative Plan: Extubation in OR  Informed Consent: I have reviewed the patients History and Physical, chart, labs and discussed the procedure including the risks, benefits and alternatives for the proposed anesthesia with the patient or authorized representative who has indicated his/her understanding and acceptance.   Dental advisory given  Plan Discussed with: CRNA and Surgeon  Anesthesia Plan Comments:         Anesthesia Quick Evaluation

## 2016-09-13 NOTE — Discharge Instructions (Signed)
Call [facility] @ [phone] if:  You have a temperature greater than or equal to 100.4 degrees Farenheit orally You have pain that is not made better by the pain medication given and taken as directed You have excessive bleeding or problems urinating  Take Colace (Docusate Sodium/Stool Softener) 100 mg 2-3 times daily while taking narcotic pain medicine to avoid constipation or until bowel movements are regular. Take with food,  Ibuprofen 600 mg every 6 hours for 5 days then as needed for pain  You may drive after 36 hours You may walk up steps  You may shower tomorrow You may resume a regular diet  Keep incisions clean and dry Avoid anything in vagina until after your post-operative visit

## 2016-09-13 NOTE — Transfer of Care (Signed)
Immediate Anesthesia Transfer of Care Note  Patient: Alexandra Hinton  Procedure(s) Performed: Procedure(s) with comments: ROBOTIC ASSISTED MYOMECTOMY with Morcellation (N/A) - power morcellation LYSIS OF ADHESION, CAUTERIZATION OF ENDOMETRIOSIS (N/A)  Patient Location: PACU  Anesthesia Type:General  Level of Consciousness: awake, alert  and oriented  Airway & Oxygen Therapy: Patient Spontanous Breathing and Patient connected to nasal cannula oxygen  Post-op Assessment: Report given to RN, Post -op Vital signs reviewed and stable and Patient moving all extremities  Post vital signs: Reviewed and stable  Last Vitals:  Vitals:   09/13/16 0858  BP: (!) 111/92  Resp: 18  Temp: 36.9 C    Last Pain:  Vitals:   09/13/16 0858  TempSrc: Oral      Patients Stated Pain Goal: 3 (43/73/57 [id number])  Complications: No apparent anesthesia complications

## 2016-09-14 ENCOUNTER — Encounter (HOSPITAL_COMMUNITY): Payer: Self-pay | Admitting: Obstetrics and Gynecology

## 2016-09-14 NOTE — Anesthesia Postprocedure Evaluation (Signed)
Anesthesia Post Note  Patient: Alexandra Hinton  Procedure(s) Performed: Procedure(s) (LRB): ROBOTIC ASSISTED MYOMECTOMY with Morcellation (N/A) LYSIS OF ADHESION, CAUTERIZATION OF ENDOMETRIOSIS (N/A)     Patient location during evaluation: PACU Anesthesia Type: General Level of consciousness: awake Pain management: pain level controlled Vital Signs Assessment: post-procedure vital signs reviewed and stable Respiratory status: spontaneous breathing Cardiovascular status: stable Postop Assessment: no signs of nausea or vomiting Anesthetic complications: no    Last Vitals:  Vitals:   09/13/16 1741 09/13/16 2013  BP: 102/65 115/77  Pulse: 87 88  Resp: 18 18  Temp: 36.6 C 37.3 C    Last Pain:  Vitals:   09/13/16 2013  TempSrc: Oral  PainSc:    Pain Goal: Patients Stated Pain Goal: 6 (09/13/16 2011)               [place]

## 2017-02-03 DIAGNOSIS — K5901 Slow transit constipation: Secondary | ICD-10-CM | POA: Insufficient documentation

## 2017-02-03 DIAGNOSIS — N809 Endometriosis, unspecified: Secondary | ICD-10-CM | POA: Insufficient documentation

## 2017-11-14 ENCOUNTER — Other Ambulatory Visit: Payer: Self-pay | Admitting: Obstetrics and Gynecology

## 2018-12-16 DIAGNOSIS — J302 Other seasonal allergic rhinitis: Secondary | ICD-10-CM | POA: Insufficient documentation

## 2018-12-16 DIAGNOSIS — N6011 Diffuse cystic mastopathy of right breast: Secondary | ICD-10-CM | POA: Insufficient documentation

## 2018-12-16 DIAGNOSIS — N6021 Fibroadenosis of right breast: Secondary | ICD-10-CM | POA: Insufficient documentation

## 2018-12-16 DIAGNOSIS — N390 Urinary tract infection, site not specified: Secondary | ICD-10-CM | POA: Insufficient documentation

## 2019-01-02 ENCOUNTER — Other Ambulatory Visit: Payer: Self-pay | Admitting: Gastroenterology

## 2019-01-02 ENCOUNTER — Other Ambulatory Visit (HOSPITAL_COMMUNITY): Payer: Self-pay | Admitting: Gastroenterology

## 2019-01-02 DIAGNOSIS — R1011 Right upper quadrant pain: Secondary | ICD-10-CM

## 2019-01-11 ENCOUNTER — Other Ambulatory Visit: Payer: Self-pay

## 2019-01-11 ENCOUNTER — Encounter (HOSPITAL_COMMUNITY)
Admission: RE | Admit: 2019-01-11 | Discharge: 2019-01-11 | Disposition: A | Discharge status: Home / Self Care | Payer: BC Managed Care – PPO | Source: Ambulatory Visit | Attending: Gastroenterology | Admitting: Gastroenterology

## 2019-01-11 DIAGNOSIS — R1011 Right upper quadrant pain: Secondary | ICD-10-CM | POA: Diagnosis present

## 2019-01-11 MED ORDER — TECHNETIUM TC 99M MEBROFENIN IV KIT
5.4100 | PACK | Freq: Once | INTRAVENOUS | Status: AC | PRN
  Administered 2019-01-11: 12:00:00 5.41 via INTRAVENOUS

## 2019-02-05 ENCOUNTER — Other Ambulatory Visit: Payer: Self-pay | Admitting: Surgery

## 2019-02-22 DIAGNOSIS — E0590 Thyrotoxicosis, unspecified without thyrotoxic crisis or storm: Secondary | ICD-10-CM

## 2019-02-22 HISTORY — DX: Thyrotoxicosis, unspecified without thyrotoxic crisis or storm: E05.90

## 2019-03-25 HISTORY — PX: CHOLECYSTECTOMY: SHX55

## 2019-04-12 ENCOUNTER — Other Ambulatory Visit: Payer: Self-pay | Admitting: Surgery

## 2019-04-16 DIAGNOSIS — Z9049 Acquired absence of other specified parts of digestive tract: Secondary | ICD-10-CM | POA: Insufficient documentation

## 2019-04-18 ENCOUNTER — Other Ambulatory Visit (HOSPITAL_BASED_OUTPATIENT_CLINIC_OR_DEPARTMENT_OTHER): Payer: Self-pay | Admitting: Surgery

## 2019-04-18 ENCOUNTER — Encounter (HOSPITAL_BASED_OUTPATIENT_CLINIC_OR_DEPARTMENT_OTHER): Payer: Self-pay

## 2019-04-18 ENCOUNTER — Ambulatory Visit (HOSPITAL_BASED_OUTPATIENT_CLINIC_OR_DEPARTMENT_OTHER)
Admission: RE | Admit: 2019-04-18 | Discharge: 2019-04-18 | Disposition: A | Discharge status: Home / Self Care | Payer: BC Managed Care – PPO | Source: Ambulatory Visit | Attending: Surgery | Admitting: Surgery

## 2019-04-18 ENCOUNTER — Other Ambulatory Visit: Payer: Self-pay

## 2019-04-18 DIAGNOSIS — K8050 Calculus of bile duct without cholangitis or cholecystitis without obstruction: Secondary | ICD-10-CM

## 2019-04-18 MED ORDER — IOHEXOL 300 MG/ML  SOLN
100.0000 mL | Freq: Once | INTRAMUSCULAR | Status: AC | PRN
  Administered 2019-04-18: 100 mL via INTRAVENOUS

## 2019-04-23 DIAGNOSIS — E0590 Thyrotoxicosis, unspecified without thyrotoxic crisis or storm: Secondary | ICD-10-CM | POA: Insufficient documentation

## 2019-05-10 ENCOUNTER — Emergency Department (HOSPITAL_BASED_OUTPATIENT_CLINIC_OR_DEPARTMENT_OTHER)
Admission: EM | Admit: 2019-05-10 | Discharge: 2019-05-10 | Disposition: A | Discharge status: Home / Self Care | Payer: BC Managed Care – PPO | Attending: Emergency Medicine | Admitting: Emergency Medicine

## 2019-05-10 ENCOUNTER — Other Ambulatory Visit: Payer: Self-pay

## 2019-05-10 ENCOUNTER — Encounter (HOSPITAL_BASED_OUTPATIENT_CLINIC_OR_DEPARTMENT_OTHER): Payer: Self-pay | Admitting: Emergency Medicine

## 2019-05-10 DIAGNOSIS — Z91040 Latex allergy status: Secondary | ICD-10-CM | POA: Insufficient documentation

## 2019-05-10 DIAGNOSIS — Z7982 Long term (current) use of aspirin: Secondary | ICD-10-CM | POA: Insufficient documentation

## 2019-05-10 DIAGNOSIS — T7840XA Allergy, unspecified, initial encounter: Secondary | ICD-10-CM | POA: Diagnosis not present

## 2019-05-10 DIAGNOSIS — Z79899 Other long term (current) drug therapy: Secondary | ICD-10-CM | POA: Diagnosis not present

## 2019-05-10 DIAGNOSIS — R21 Rash and other nonspecific skin eruption: Secondary | ICD-10-CM | POA: Diagnosis present

## 2019-05-10 HISTORY — DX: Thyrotoxicosis, unspecified without thyrotoxic crisis or storm: E05.90

## 2019-05-10 MED ORDER — HYDROXYZINE HCL 25 MG PO TABS: 25.0000 mg | ORAL_TABLET | Freq: Four times a day (QID) | ORAL | 0 refills | Status: DC | PRN

## 2019-05-10 MED ORDER — FAMOTIDINE IN NACL 20-0.9 MG/50ML-% IV SOLN
20.0000 mg | Freq: Once | INTRAVENOUS | Status: AC
  Administered 2019-05-10: 20 mg via INTRAVENOUS
  Filled 2019-05-10: qty 50

## 2019-05-10 MED ORDER — DIPHENHYDRAMINE HCL 50 MG/ML IJ SOLN
50.0000 mg | Freq: Once | INTRAMUSCULAR | Status: AC
  Administered 2019-05-10: 50 mg via INTRAVENOUS
  Filled 2019-05-10: qty 1

## 2019-05-10 MED ORDER — SODIUM CHLORIDE 0.9 % IV SOLN
INTRAVENOUS | Status: DC | PRN
  Administered 2019-05-10: 500 mL via INTRAVENOUS

## 2019-05-10 NOTE — ED Triage Notes (Signed)
Patient presents with complaints of generalized rash; states went to urgent care 2 days ago and given steroid injection and benadryl injection and prescribed prednisone dose pack and taking benadryl otc and allegra otc. States started new medication this week for thyroid; states last dose Tuesday of this week.

## 2019-05-10 NOTE — ED Provider Notes (Signed)
[facility] Provider Note: Alexandra Spurling, MD, FACEP  CSN: [id number] MRN: [id number] ARRIVAL: 05/10/19 at [place]: [place]  Allergic Reaction   HISTORY OF PRESENT ILLNESS  05/10/19 3:02 AM Alexandra Hinton is a 44 y.o. female who was placed on methimazole on March 1 for hyperthyroidism.  On 05/08/2019 she developed a generalized urticarial rash and discontinued the methimazole.  She went to an urgent care and was given injections of methylprednisolone and Benadryl with significant improvement of the rash.  She was placed on a prednisone Dosepak and Benadryl every 6 hours.  Despite this her rash recurred yesterday.  The rash is generalized but with predominance at joints.  She denies throat swelling, shortness of breath, nausea, vomiting or diarrhea.  Her last dose of Benadryl was about 6 hours ago.   Past Medical History:  Diagnosis Date  . Fibroids   . Headache   . Hyperthyroidism   . IBS (irritable bowel syndrome)    no meds. diet controlled  . PONV (postoperative nausea and vomiting)   . Rosacea     Past Surgical History:  Procedure Laterality Date  . CHOLECYSTECTOMY    . kybella  08/2016  . LYSIS OF ADHESION N/A 09/13/2016   Procedure: LYSIS OF ADHESION, CAUTERIZATION OF ENDOMETRIOSIS;  Surgeon: Delsa Bern, MD;  Location: [facility];  Service: Gynecology;  Laterality: N/A;  . ROBOT ASSISTED MYOMECTOMY N/A 09/13/2016   Procedure: ROBOTIC ASSISTED MYOMECTOMY with Morcellation;  Surgeon: Delsa Bern, MD;  Location: [facility];  Service: Gynecology;  Laterality: N/A;  power morcellation  . robotic myomectomy  12/2011   in [place], [place]  . WISDOM TOOTH EXTRACTION      No family history on file.  Social History   Tobacco Use  . Smoking status: Never Smoker  . Smokeless tobacco: Never Used  Substance Use Topics  . Alcohol use: Yes    Alcohol/week: 2.0 standard drinks    Types: 2 Glasses of wine per week  . Drug use: No    Prior to  Admission medications   Medication Sig Start Date End Date Taking? Authorizing Provider  ACZONE 7.5 % GEL Apply 1 application topically daily as needed. For acne. 07/11/16   [provider]  aspirin 81 MG chewable tablet Chew 81 mg by mouth daily.    [provider]  Cholecalciferol (VITAMIN D3) 2000 units CHEW Chew 2,500 Units by mouth daily. CHEWABLE    [provider]  clotrimazole-betamethasone (LOTRISONE) cream Apply 1 application topically daily as needed. For skin irritation or scalp irritation.    [provider]  FINACEA 15 % cream Apply 1 application topically daily as needed. For rosacea. 07/11/16   [provider]  hydrOXYzine (ATARAX/VISTARIL) 25 MG tablet Take 1-2 tablets (25-50 mg total) by mouth every 6 (six) hours as needed for itching (May cause drowsiness.). 05/10/19   Alexandra Zillmer, MD  Multiple Vitamin (MULTIVITAMIN WITH MINERALS) TABS tablet Take 1 tablet by mouth daily.    [provider]  Omega-3 Fatty Acids (FISH OIL PO) Take 2 tablets by mouth daily. CHEWABLES.    [provider]  triamcinolone cream (KENALOG) 0.1 % Apply 1 application topically daily as needed. For razor burn prevention.    [provider]    Allergies Amoxicillin-pot clavulanate, Cefaclor, Percocet [oxycodone-acetaminophen], Sulfamethoxazole-trimethoprim, Tape, Erythromycin, and Latex   REVIEW OF SYSTEMS  Negative except as noted here or in the History of Present Illness.   PHYSICAL EXAMINATION  Initial Vital Signs Blood pressure 135/86, pulse 92, temperature 98.2 F (36.8 C), temperature source Oral, resp. rate 18, height 5\' 4"  (1.626 m), weight 69.9 kg, last menstrual period 04/26/2019, SpO2 100 %.  Examination General: Well-developed, well-nourished female in no acute distress; appearance consistent with age of record HENT: normocephalic; atraumatic Eyes: pupils equal, round and reactive to light; extraocular muscles  intact Neck: supple Heart: regular rate and rhythm Lungs: clear to auscultation bilaterally Abdomen: soft; nondistended; nontender; bowel sounds present Extremities: No deformity; full range of motion; pulses normal Neurologic: Awake, alert and oriented; motor function intact in all extremities and symmetric; no facial droop Skin: Warm and dry; generalized urticarial rash with predominance at joints:        Psychiatric: Normal mood and affect   RESULTS  Summary of this visit's results, reviewed and interpreted by myself:   EKG Interpretation  Date/Time:    Ventricular Rate:    PR Interval:    QRS Duration:   QT Interval:    QTC Calculation:   R Axis:     Text Interpretation:        Laboratory Studies: No results found for this or any previous visit (from the past 24 hour(s)). Imaging Studies: No results found.  ED COURSE and MDM  Nursing notes, initial and subsequent vitals signs, including pulse oximetry, reviewed and interpreted by myself.  Vitals:   05/10/19 0252 05/10/19 0257  BP:  135/86  Pulse:  92  Resp:  18  Temp:  98.2 F (36.8 C)  TempSrc:  Oral  SpO2:  100%  Weight: 69.9 kg   Height: 5\' 4"  (1.626 m)    Medications  0.9 %  sodium chloride infusion ( Intravenous Stopped 05/10/19 0520)  diphenhydrAMINE (BENADRYL) injection 50 mg (50 mg Intravenous Given 05/10/19 0335)  famotidine (PEPCID) IVPB 20 mg premix ( Intravenous Stopped 05/10/19 0423)    5:28 AM Minimal improvement after IV Benadryl and Pepcid.  Patient to discontinue taking Allegra as it may be competing at histamine receptor sites with the Benadryl.  We will trial hydroxyzine as an alternative to Benadryl as it may work better although she was advised it may cause increased drowsiness.  She was advised to continue the prednisone.   PROCEDURES  Procedures   ED DIAGNOSES     ICD-10-CM   1. Allergic reaction, initial encounter  T78.40XA        Pacer Dorn, Jenny Reichmann, MD 05/10/19 [phone]

## 2019-05-16 ENCOUNTER — Other Ambulatory Visit: Payer: Self-pay

## 2019-05-16 ENCOUNTER — Emergency Department (HOSPITAL_BASED_OUTPATIENT_CLINIC_OR_DEPARTMENT_OTHER)
Admission: EM | Admit: 2019-05-16 | Discharge: 2019-05-16 | Disposition: A | Discharge status: Home / Self Care | Payer: BC Managed Care – PPO | Attending: Emergency Medicine | Admitting: Emergency Medicine

## 2019-05-16 ENCOUNTER — Encounter (HOSPITAL_BASED_OUTPATIENT_CLINIC_OR_DEPARTMENT_OTHER): Payer: Self-pay | Admitting: *Deleted

## 2019-05-16 DIAGNOSIS — Z79899 Other long term (current) drug therapy: Secondary | ICD-10-CM | POA: Insufficient documentation

## 2019-05-16 DIAGNOSIS — T50905A Adverse effect of unspecified drugs, medicaments and biological substances, initial encounter: Secondary | ICD-10-CM | POA: Diagnosis not present

## 2019-05-16 DIAGNOSIS — Z7982 Long term (current) use of aspirin: Secondary | ICD-10-CM | POA: Diagnosis not present

## 2019-05-16 DIAGNOSIS — G251 Drug-induced tremor: Secondary | ICD-10-CM | POA: Insufficient documentation

## 2019-05-16 DIAGNOSIS — Z91040 Latex allergy status: Secondary | ICD-10-CM | POA: Insufficient documentation

## 2019-05-16 LAB — CBC WITH DIFFERENTIAL/PLATELET
Abs Immature Granulocytes: 0.21 10*3/uL — ABNORMAL HIGH (ref 0.00–0.07)
Basophils Absolute: 0.1 10*3/uL (ref 0.0–0.1)
Basophils Relative: 0 %
Eosinophils Absolute: 0.1 10*3/uL (ref 0.0–0.5)
Eosinophils Relative: 1 %
HCT: 48 % — ABNORMAL HIGH (ref 36.0–46.0)
Hemoglobin: 16.2 g/dL — ABNORMAL HIGH (ref 12.0–15.0)
Immature Granulocytes: 1 %
Lymphocytes Relative: 11 %
Lymphs Abs: 1.9 10*3/uL (ref 0.7–4.0)
MCH: 30.9 pg (ref 26.0–34.0)
MCHC: 33.8 g/dL (ref 30.0–36.0)
MCV: 91.4 fL (ref 80.0–100.0)
Monocytes Absolute: 0.5 10*3/uL (ref 0.1–1.0)
Monocytes Relative: 3 %
Neutro Abs: 14.7 10*3/uL — ABNORMAL HIGH (ref 1.7–7.7)
Neutrophils Relative %: 84 %
Platelets: 376 10*3/uL (ref 150–400)
RBC: 5.25 MIL/uL — ABNORMAL HIGH (ref 3.87–5.11)
RDW: 12.4 % (ref 11.5–15.5)
WBC: 17.4 10*3/uL — ABNORMAL HIGH (ref 4.0–10.5)
nRBC: 0 % (ref 0.0–0.2)

## 2019-05-16 LAB — COMPREHENSIVE METABOLIC PANEL
ALT: 13 U/L (ref 0–44)
AST: 14 U/L — ABNORMAL LOW (ref 15–41)
Albumin: 3.8 g/dL (ref 3.5–5.0)
Alkaline Phosphatase: 48 U/L (ref 38–126)
Anion gap: 9 (ref 5–15)
BUN: 14 mg/dL (ref 6–20)
CO2: 29 mmol/L (ref 22–32)
Calcium: 9.1 mg/dL (ref 8.9–10.3)
Chloride: 98 mmol/L (ref 98–111)
Creatinine, Ser: 0.84 mg/dL (ref 0.44–1.00)
GFR calc Af Amer: >60 mL/min (ref >60)
GFR calc non Af Amer: >60 mL/min (ref >60)
Glucose, Bld: 102 mg/dL — ABNORMAL HIGH (ref 70–99)
Potassium: 4.3 mmol/L (ref 3.5–5.1)
Sodium: 136 mmol/L (ref 135–145)
Total Bilirubin: 0.5 mg/dL (ref 0.3–1.2)
Total Protein: 6.7 g/dL (ref 6.5–8.1)

## 2019-05-16 LAB — TSH: TSH: 0.31 u[IU]/mL — ABNORMAL LOW (ref 0.350–4.500)

## 2019-05-16 LAB — PREGNANCY, URINE: Preg Test, Ur: NEGATIVE

## 2019-05-16 MED ORDER — ALUM & MAG HYDROXIDE-SIMETH 200-200-20 MG/5ML PO SUSP
30.0000 mL | Freq: Once | ORAL | Status: AC
  Administered 2019-05-16: 30 mL via ORAL
  Filled 2019-05-16: qty 30

## 2019-05-16 MED ORDER — SODIUM CHLORIDE 0.9 % IV BOLUS
1000.0000 mL | Freq: Once | INTRAVENOUS | Status: AC
  Administered 2019-05-16: 1000 mL via INTRAVENOUS

## 2019-05-16 NOTE — ED Provider Notes (Signed)
[facility] Provider Note   CSN: [id number] Arrival date & time: 05/16/19  1009     History Chief Complaint  Patient presents with  . Shaking    on steroids    Alexandra Hinton is a 44 y.o. female with a past medical history of hyperthyroidism presenting to the ED with a chief complaint of tremors.  Patient was found to have hyperthyroidism on 05/08/2019 and was prescribed methimazole.  She took several doses of this, then presented with concerns for allergic reaction.  She was given Solu-Medrol as well as a steroid Dosepak.  States that she continued to have hives and itching up until 2 days ago.  She continues to have tremors and shakes which she did not have until she started having the allergic reaction to the medication.  She did not have any of the symptoms when she was diagnosed with hyperthyroidism.  She also reports some burning sensation in her chest and feeling like her pills are getting stuck in her esophagus.  This similarly happened to her last week when she presented for allergic reaction and her symptoms resolved with a GI cocktail.  She denies any other chest pain or pleuritic pain, shortness of breath, abdominal pain, vomiting, diarrhea, fever or seizures. She denies possibility of pregnancy. She is not currently being treated for her hyperthyroidism but is currently discussing with her endocrinologist about possible radioactive iodine ablation.  HPI     Past Medical History:  Diagnosis Date  . Fibroids   . Headache   . Hyperthyroidism   . IBS (irritable bowel syndrome)    no meds. diet controlled  . PONV (postoperative nausea and vomiting)   . Rosacea     Patient Active Problem List   Diagnosis Date Noted  . Fibroid, uterine 09/13/2016    Past Surgical History:  Procedure Laterality Date  . CHOLECYSTECTOMY    . kybella  08/2016  . LYSIS OF ADHESION N/A 09/13/2016   Procedure: LYSIS OF ADHESION, CAUTERIZATION OF ENDOMETRIOSIS;   Surgeon: Delsa Bern, MD;  Location: [facility];  Service: Gynecology;  Laterality: N/A;  . ROBOT ASSISTED MYOMECTOMY N/A 09/13/2016   Procedure: ROBOTIC ASSISTED MYOMECTOMY with Morcellation;  Surgeon: Delsa Bern, MD;  Location: [facility];  Service: Gynecology;  Laterality: N/A;  power morcellation  . robotic myomectomy  12/2011   in [place], [place]  . WISDOM TOOTH EXTRACTION       OB History    Gravida  1   Para      Term      Preterm      AB      Living        SAB      TAB      Ectopic      Multiple      Live Births              History reviewed. No pertinent family history.  Social History   Tobacco Use  . Smoking status: Never Smoker  . Smokeless tobacco: Never Used  Substance Use Topics  . Alcohol use: Yes    Alcohol/week: 2.0 standard drinks    Types: 2 Glasses of wine per week  . Drug use: No    Home Medications Prior to Admission medications   Medication Sig Start Date End Date Taking? Authorizing Provider  ACZONE 7.5 % GEL Apply 1 application topically daily as needed. For acne. 07/11/16   [provider]  aspirin 81 MG chewable  tablet Chew 81 mg by mouth daily.    [provider]  Cholecalciferol (VITAMIN D3) 2000 units CHEW Chew 2,500 Units by mouth daily. CHEWABLE    [provider]  clotrimazole-betamethasone (LOTRISONE) cream Apply 1 application topically daily as needed. For skin irritation or scalp irritation.    [provider]  FINACEA 15 % cream Apply 1 application topically daily as needed. For rosacea. 07/11/16   [provider]  hydrOXYzine (ATARAX/VISTARIL) 25 MG tablet Take 1-2 tablets (25-50 mg total) by mouth every 6 (six) hours as needed for itching (May cause drowsiness.). 05/10/19   Molpus, John, MD  Multiple Vitamin (MULTIVITAMIN WITH MINERALS) TABS tablet Take 1 tablet by mouth daily.    [provider]  Omega-3 Fatty Acids (FISH OIL PO) Take 2 tablets by mouth daily.  CHEWABLES.    [provider]  triamcinolone cream (KENALOG) 0.1 % Apply 1 application topically daily as needed. For razor burn prevention.    [provider]    Allergies    Amoxicillin-pot clavulanate, Cefaclor, Percocet [oxycodone-acetaminophen], Sulfamethoxazole-trimethoprim, Tapazole [methimazole], Tape, Erythromycin, and Latex  Review of Systems   Review of Systems  Constitutional: Negative for appetite change, chills and fever.  HENT: Negative for ear pain, rhinorrhea, sneezing and sore throat.   Eyes: Negative for photophobia and visual disturbance.  Respiratory: Negative for cough, chest tightness, shortness of breath and wheezing.   Cardiovascular: Negative for chest pain and palpitations.  Gastrointestinal: Negative for abdominal pain, blood in stool, constipation, diarrhea, nausea and vomiting.  Genitourinary: Negative for dysuria, hematuria and urgency.  Musculoskeletal: Negative for myalgias.  Skin: Negative for rash.  Neurological: Positive for tremors. Negative for dizziness, weakness and light-headedness.    Physical Exam Updated Vital Signs BP (!) 113/92 (BP Location: Right Arm)   Pulse 83   Temp 99.1 F (37.3 C) (Oral)   Resp 16   Ht 5\' 4"  (1.626 m)   Wt 76.8 kg   LMP 04/26/2019   SpO2 95%   BMI 29.06 kg/m   Physical Exam Vitals and nursing note reviewed.  Constitutional:      General: She is not in acute distress.    Appearance: She is well-developed.     Comments: No tremors noted on exam.  Speaking in complete sentences without difficulty.  HENT:     Head: Normocephalic and atraumatic.     Nose: Nose normal.  Eyes:     General: No scleral icterus.       Left eye: No discharge.     Conjunctiva/sclera: Conjunctivae normal.  Cardiovascular:     Rate and Rhythm: Normal rate and regular rhythm.     Heart sounds: Normal heart sounds. No murmur. No friction rub. No gallop.   Pulmonary:     Effort: Pulmonary effort is normal. No  respiratory distress.     Breath sounds: Normal breath sounds.  Abdominal:     General: Bowel sounds are normal. There is no distension.     Palpations: Abdomen is soft.     Tenderness: There is no abdominal tenderness. There is no guarding.  Musculoskeletal:        General: Normal range of motion.     Cervical back: Normal range of motion and neck supple.     Comments: No lower extremity edema, erythema or calf tenderness bilaterally.  Skin:    General: Skin is warm and dry.     Findings: No rash.  Neurological:     Mental Status: She is  alert.     Motor: No abnormal muscle tone.     Coordination: Coordination normal.     ED Results / Procedures / Treatments   Labs (all labs ordered are listed, but only abnormal results are displayed) Labs Reviewed  COMPREHENSIVE METABOLIC PANEL - Abnormal; Notable for the following components:      Result Value   Glucose, Bld 102 (*)    AST 14 (*)    All other components within normal limits  CBC WITH DIFFERENTIAL/PLATELET - Abnormal; Notable for the following components:   WBC 17.4 (*)    RBC 5.25 (*)    Hemoglobin 16.2 (*)    HCT 48.0 (*)    Neutro Abs 14.7 (*)    Abs Immature Granulocytes 0.21 (*)    All other components within normal limits  PREGNANCY, URINE  TSH    EKG EKG Interpretation  Date/Time:  Thursday May 16 2019 10:20:06 EDT Ventricular Rate:  86 PR Interval:    QRS Duration: 74 QT Interval:  340 QTC Calculation: 407 R Axis:   14 Text Interpretation: Sinus rhythm Borderline short PR interval Baseline wander in lead(s) II III aVF No old tracing to compare Confirmed by Dorie Rank [phone]) on 05/16/2019 10:27:28 AM   Radiology No results found.  Procedures Procedures (including critical care time)  Medications Ordered in ED Medications  sodium chloride 0.9 % bolus 1,000 mL (0 mLs Intravenous Stopped 05/16/19 1203)  alum & mag hydroxide-simeth (MAALOX/MYLANTA) 200-200-20 MG/5ML suspension 30 mL (30 mLs Oral  Given 05/16/19 1054)    ED Course  I have reviewed the triage vital signs and the nursing notes.  Pertinent labs & imaging results that were available during my care of the patient were reviewed by me and considered in my medical decision making (see chart for details).  Clinical Course as of May 16 1314  Thu May 16, 2019  1200 Prior labwork faxed over from GYN office shows TSH of 0.021 on 04/06/2019.   [HK]  1200 Suspect 2/2 steroid use.  WBC(!): 17.4 [HK]    Clinical Course User Index [HK] Delia Heady, PA-C   MDM Rules/Calculators/A&P                      44 year old female with a past medical history of hypothyroidism not currently on treatment presenting to the ED for tremors.  She started taking methimazole and several doses later started experiencing allergic reaction symptoms.  She was treated with 2 doses of IM steroids and discharged home with a steroid Dosepak.  She took the first dose of the steroid Dosepak but was then told to take a prednisone burst, 50 mg for 5 days.  She completed this 2 days ago.  She continues to have palpitations, feels that she is shaking.  She took 20 mg of prednisone this morning as she was concerned that this is happening because she did not taper the steroids.  On exam patient is overall well-appearing.  She does not appear tremulous.  She is not tachycardic, tachypneic or hypoxic.  She is afebrile here.  Blood pressure within normal limits.  She also complains of epigastric pain and concerns for GERD.  No lower extremity edema, erythema or calf tenderness.  Lab work here with leukocytosis of 17 which is most likely secondary to her steroid use.  Pregnancy test is negative.  CMP is unremarkable.  EKG shows normal sinus rhythm.  TSH is pending.  Anticipate that this will be low based  on her history of hyperthyroidism.  Believe that her symptoms could be due to her prednisone use rather than infectious cause or emergent cause such as thyroid storm.  She  reports improvement in her symptoms with IV fluids and GI cocktail given here.  Her vital signs remained stable.  We will have her follow-up with TSH when it is available but did I urged her to speak with her PCP and endocrinologist regarding risk and benefits of continuing tapering the prednisone.  States that her allergic reaction symptoms have resolved and denies any current angioedema or anaphylaxis, and I see no evidence of this on my exam today. States that she will follow up and return for worsening symptoms.  Patient is hemodynamically stable, in NAD, and able to ambulate in the ED. Evaluation does not show pathology that would require ongoing emergent intervention or inpatient treatment. I have personally reviewed and interpreted all lab work and imaging at today's ED visit. I explained the diagnosis to the patient. Pain has been managed and has no complaints prior to discharge. Patient is comfortable with above plan and is stable for discharge at this time. All questions were answered prior to disposition. Strict return precautions for returning to the ED were discussed. Encouraged follow up with PCP.   An After Visit Summary was printed and given to the patient.   Portions of this note were generated with [employer]. Dictation errors may occur despite best attempts at proofreading.  Final Clinical Impression(s) / ED Diagnoses Final diagnoses:  Adverse effect of drug, initial encounter    Rx / DC Orders ED Discharge Orders    None       Delia Heady, PA-C 05/16/19 1316    Dorie Rank, MD 05/17/19 1438

## 2019-05-16 NOTE — Discharge Instructions (Addendum)
Your lab work today did not show any significant abnormalities except for your high white blood cell count which is most likely due to your recent steroid use. Your symptoms are most likely due to your prednisone use. We will follow up with results of your TSH lab when it is available. Return to the ER if you start to experience worsening palpitations, tremors, chest pain, shortness of breath, leg swelling.

## 2019-05-16 NOTE — ED Triage Notes (Addendum)
Past Wednesday, March 17th, she was prescribed with Tapazole and had an allergic reaction. Went to [facility] for the same complaint,  Methylprednisolone, Benadryl shots for allergic reaction.  Was given Allegra and Prednisone upon discharged at [facility]. Past Thursday, came here for burning sensation from the hives.  Today, patient felt like her inside is trembling and rapid heart rate 118; BP 148/100 at 0750.

## 2021-01-08 IMAGING — CT CT ABD-PELV W/ CM
2 of 5 series · 15 of 46 positions shown, 17 images · IV contrast (Omnipaque)
Comparison: CT abdomen pelvis, 01/31/2013

CLINICAL DATA: Biliary colic, gallbladder removed 219, fever,
evaluate for postoperative infection or other complication

EXAM:
CT ABDOMEN AND PELVIS WITH CONTRAST
TECHNIQUE: Multidetector CT imaging of the abdomen and pelvis was performed
using the standard protocol following bolus administration of
intravenous contrast.
CONTRAST:  100mL OMNIPAQUE IOHEXOL 300 MG/ML SOLN, additional oral
enteric contrast

[Series 2: axial st · axial · 0.89mm/px · z∈[-424,-38]mm · 12 of 91 slices shown, 14 images]
[im 7/91  soft-tissue]
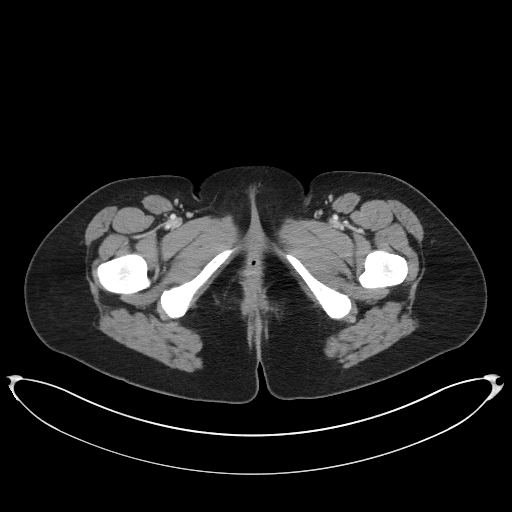
[im 7/91  bone]
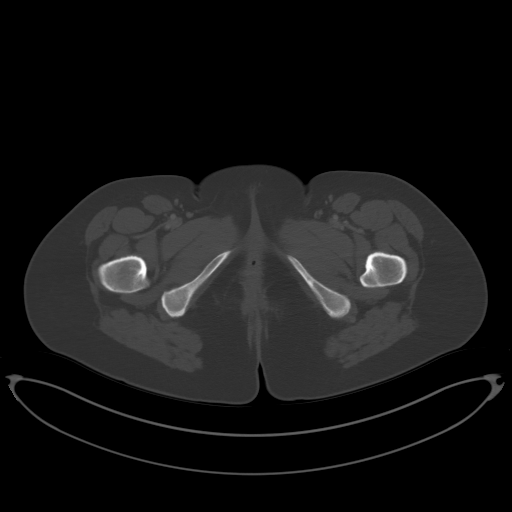
[im 14/91  soft-tissue]
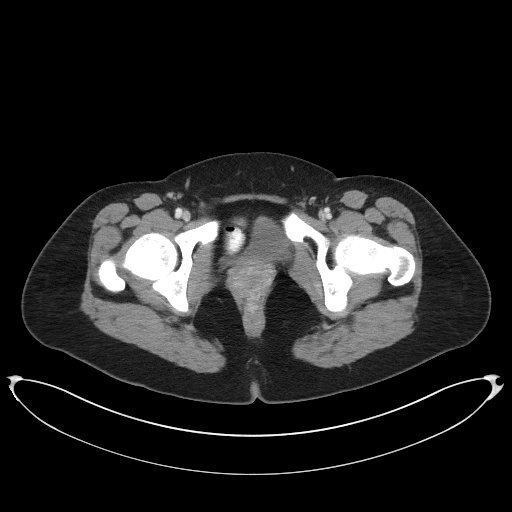
[im 21/91  soft-tissue]
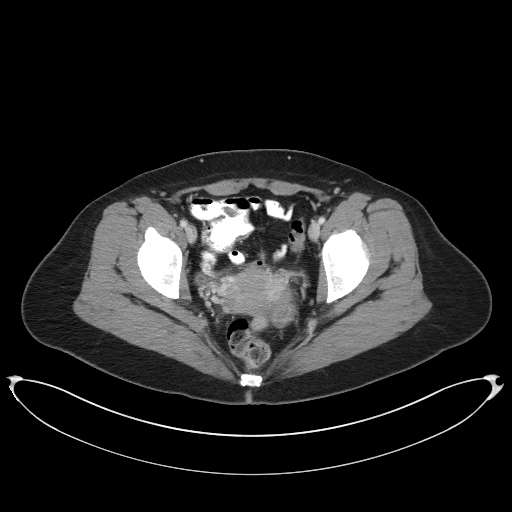
[im 28/91  soft-tissue]
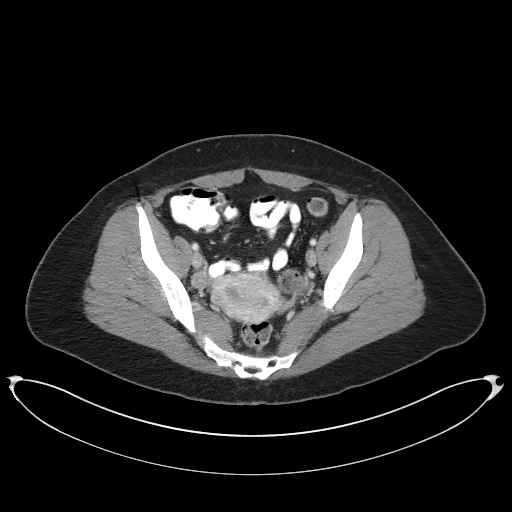
[im 35/91  soft-tissue]
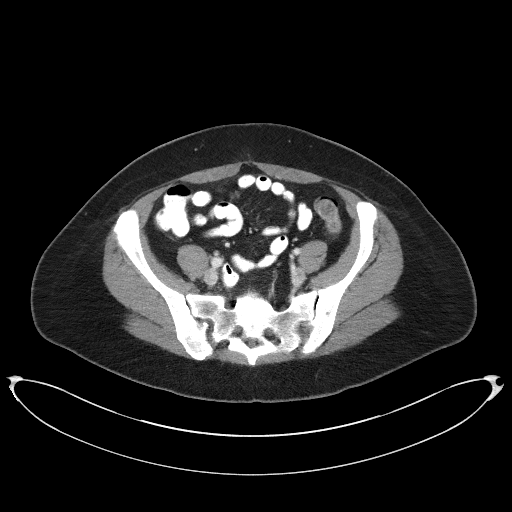
[im 42/91  soft-tissue]
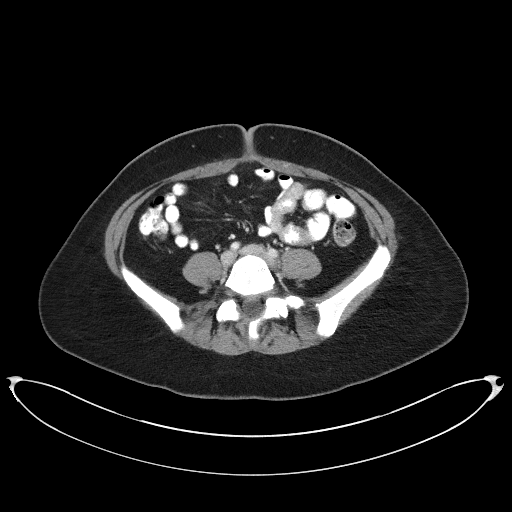
[im 49/91  soft-tissue]
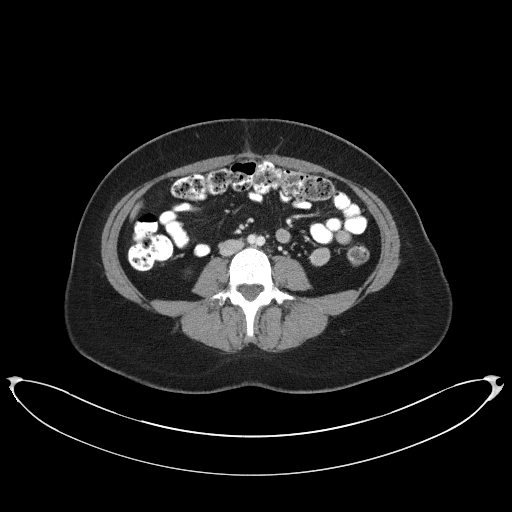
[im 56/91  soft-tissue]
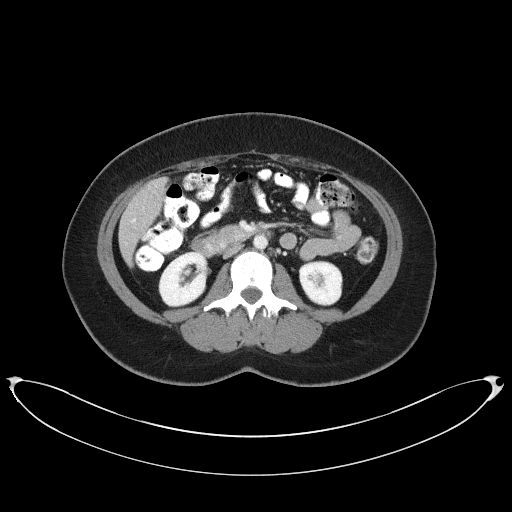
[im 63/91  soft-tissue]
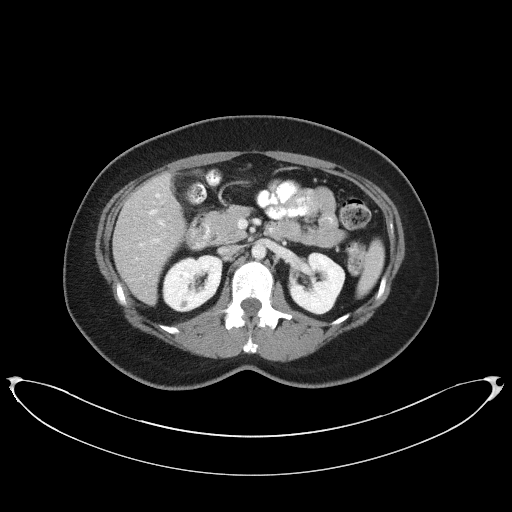
[im 63/91  bone]
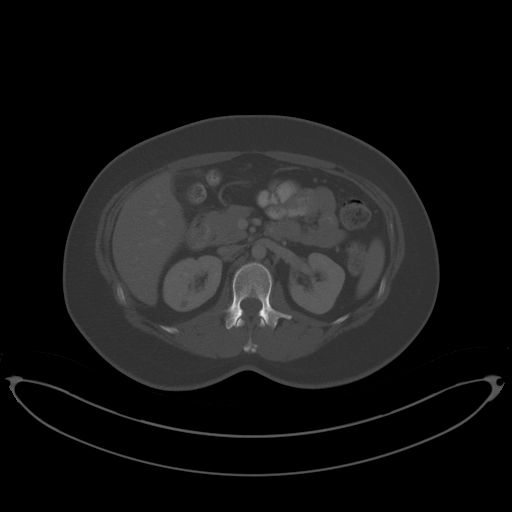
[im 70/91  soft-tissue]
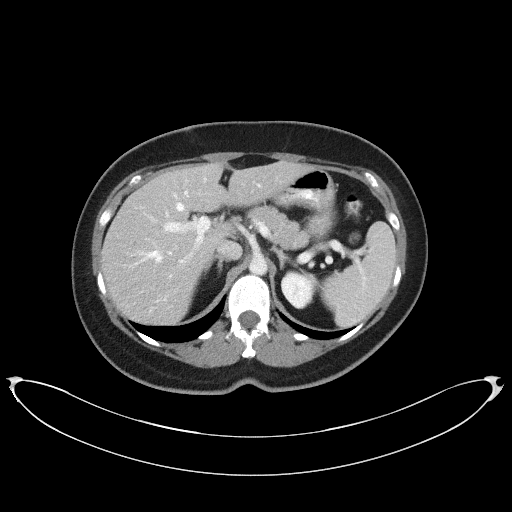
[im 77/91  soft-tissue]
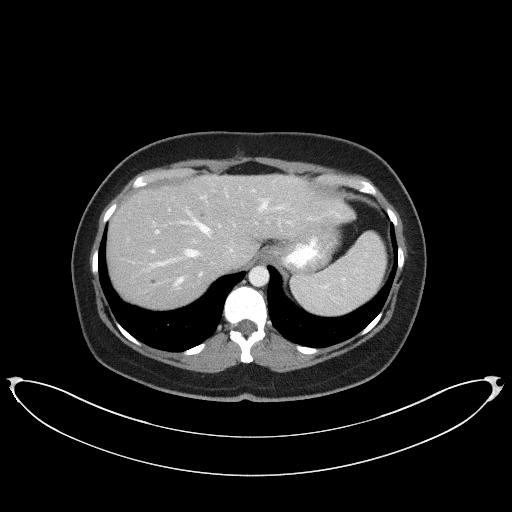
[im 84/91  soft-tissue]
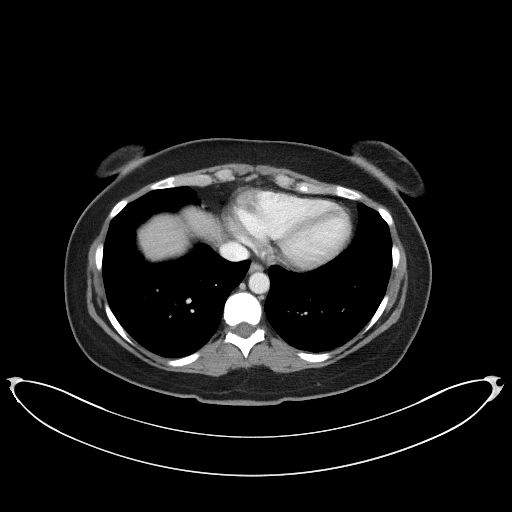

[Series 5: coronal st · coronal · 0.76mm/px · 3 of 84 slices shown]
[im 28/84  soft-tissue]
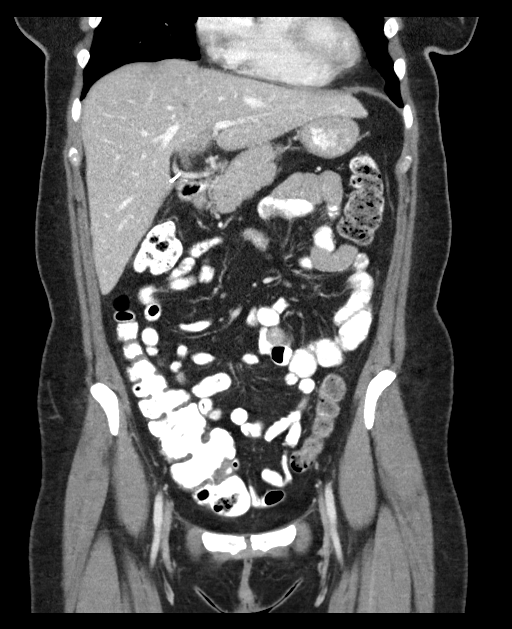
[im 37/84  soft-tissue]
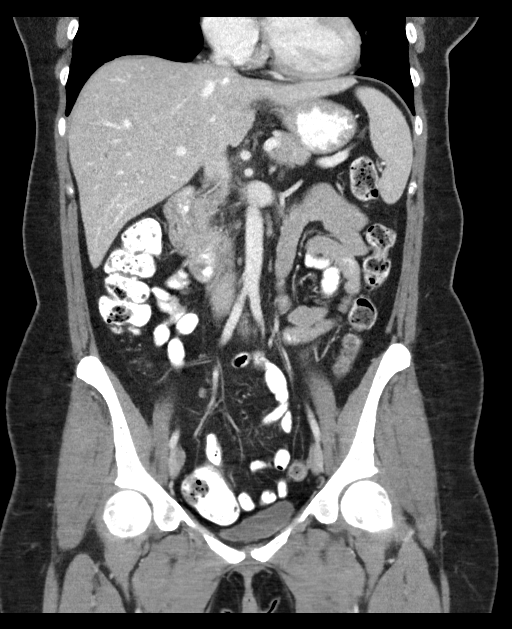
[im 47/84  soft-tissue]
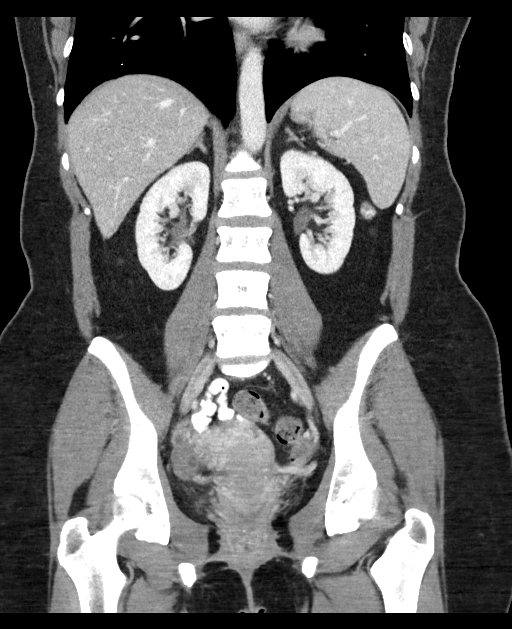

[15 of 46 positions shown; findings below may reference images not displayed]

FINDINGS: Lower chest: No acute abnormality.

Hepatobiliary: Tiny subcentimeter low-attenuation lesions of the
right lobe of the liver, too small to strictly characterize although
likely simple cysts. Status post interval cholecystectomy with
minimal fat stranding in the gallbladder fossa (series 2, image 28).
No biliary dilatation.

Pancreas: Unremarkable. No pancreatic ductal dilatation or
surrounding inflammatory changes.

Spleen: Normal in size without significant abnormality.

Adrenals/Urinary Tract: Adrenal glands are unremarkable. Kidneys are
normal, without renal calculi, solid lesion, or hydronephrosis.
Bladder is unremarkable.

Stomach/Bowel: Stomach is within normal limits. Appendix appears
normal. No evidence of bowel wall thickening, distention, or
inflammatory changes.

Vascular/Lymphatic: No significant vascular findings are present. No
enlarged abdominal or pelvic lymph nodes.

Reproductive: No mass or other significant abnormality. Fluid
attenuation cysts or follicles of the bilateral ovaries.

Other: Postoperative stranding about the umbilicus, in keeping with
laparoscopy port access. No abdominopelvic ascites.

Musculoskeletal: No acute or significant osseous findings.
IMPRESSION: Status post interval cholecystectomy with minimal fat stranding in
the gallbladder fossa, likely postsurgical. No fluid collection,
biliary ductal dilatation, ascites, or other evidence of
postoperative complication.

## 2021-02-21 DIAGNOSIS — R0789 Other chest pain: Secondary | ICD-10-CM

## 2021-02-21 DIAGNOSIS — R002 Palpitations: Secondary | ICD-10-CM

## 2021-02-21 HISTORY — DX: Other chest pain: R07.89

## 2021-02-21 HISTORY — DX: Palpitations: R00.2

## 2021-10-13 ENCOUNTER — Telehealth: Payer: Self-pay

## 2021-10-13 NOTE — Telephone Encounter (Signed)
NOTES UNDER MEDIA

## 2021-11-08 ENCOUNTER — Encounter: Payer: Self-pay | Admitting: Cardiology

## 2021-12-20 ENCOUNTER — Ambulatory Visit: Payer: BC Managed Care – PPO | Admitting: Cardiology

## 2022-01-09 NOTE — Progress Notes (Deleted)
Cardiology Office Note:    Date:  01/09/2022   ID:  Alexandra Hinton, DOB 08-24-1975, MRN [id number]  PCP:  Alexandra Chessman, MD   [facility] Providers Cardiologist:  None    Referring MD: Alexandra Chessman, MD    History of Present Illness:    Alexandra Hinton is a 46 y.o. female with a hx of hyperthyroidism who was referred by Dr. Riley Hinton for further evaluation of palpitations and chest pain.  Patient seen by Dr. Riley Hinton on 09/24/21. Reported palpitations and chest heaviness at that visit. Notably was hyperthyroid in the past and was placed on tapazole but had anaphylaxis to the medication and therefore it was stopped. Given symptoms, she is now referred to Cardiology for further evaluation.  Today, ***  Past Medical History:  Diagnosis Date   Fibroids    Headache    Hyperthyroidism    Hyperthyroidism    IBS (irritable bowel syndrome)    no meds. diet controlled   Other chest pain    Palpitations    PONV (postoperative nausea and vomiting)    Rosacea     Past Surgical History:  Procedure Laterality Date   CHOLECYSTECTOMY     kybella  08/2016   LYSIS OF ADHESION N/A 09/13/2016   Procedure: LYSIS OF ADHESION, CAUTERIZATION OF ENDOMETRIOSIS;  Surgeon: Silverio Lay, MD;  Location: [facility];  Service: Gynecology;  Laterality: N/A;   ROBOT ASSISTED MYOMECTOMY N/A 09/13/2016   Procedure: ROBOTIC ASSISTED MYOMECTOMY with Morcellation;  Surgeon: Silverio Lay, MD;  Location: [facility];  Service: Gynecology;  Laterality: N/A;  power morcellation   robotic myomectomy  12/2011   in [place], [place]   WISDOM TOOTH EXTRACTION      Current Medications: No outpatient medications have been marked as taking for the 01/11/22 encounter (Appointment) with Meriam Sprague, MD.     Allergies:   Amoxicillin-pot clavulanate, Cefaclor, Percocet [oxycodone-acetaminophen], Sulfamethoxazole-trimethoprim, Tapazole [methimazole], Tape, Erythromycin, and Latex   Social History    Socioeconomic History   Marital status: Divorced    Spouse name: Not on file   Number of children: Not on file   Years of education: Not on file   Highest education level: Not on file  Occupational History   Not on file  Tobacco Use   Smoking status: Never   Smokeless tobacco: Never  Vaping Use   Vaping Use: Never used  Substance and Sexual Activity   Alcohol use: Yes    Alcohol/week: 2.0 standard drinks of alcohol    Types: 2 Glasses of wine per week   Drug use: No   Sexual activity: Yes    Birth control/protection: Condom  Other Topics Concern   Not on file  Social History Narrative   Not on file   Social Determinants of Health   Financial Resource Strain: Not on file  Food Insecurity: Not on file  Transportation Needs: Not on file  Physical Activity: Not on file  Stress: Not on file  Social Connections: Not on file     Family History: The patient's ***family history includes Breast cancer in her paternal grandmother; Hyperlipidemia in her father and mother; Hypothyroidism in her mother; Leukemia in her maternal grandfather; Multiple myeloma in her father; Ovarian cancer in her paternal grandmother; Prostate cancer in her maternal grandfather; Stroke in her mother.  ROS:   Please see the history of present illness.    *** All other systems reviewed and are negative.  EKGs/Labs/Other Studies Reviewed:  The following studies were reviewed today: ***  EKG:  EKG is *** ordered today.  The ekg ordered today demonstrates ***  Recent Labs: No results found for requested labs within last 365 days.  Recent Lipid Panel No results found for: "CHOL", "TRIG", "HDL", "CHOLHDL", "VLDL", "LDLCALC", "LDLDIRECT"   Risk Assessment/Calculations:   {Does this patient have ATRIAL FIBRILLATION?:[phone]}  No BP recorded.  {Refresh Note OR Click here to enter BP  :1}***         Physical Exam:    VS:  There were no vitals taken for this visit.    Wt Readings from  Last 3 Encounters:  05/16/19 169 lb 4.8 oz (76.8 kg)  05/10/19 154 lb 1.6 oz (69.9 kg)  09/13/16 154 lb (69.9 kg)     GEN: *** Well nourished, well developed in no acute distress HEENT: Normal NECK: No JVD; No carotid bruits LYMPHATICS: No lymphadenopathy CARDIAC: ***RRR, no murmurs, rubs, gallops RESPIRATORY:  Clear to auscultation without rales, wheezing or rhonchi  ABDOMEN: Soft, non-tender, non-distended MUSCULOSKELETAL:  No edema; No deformity  SKIN: Warm and dry NEUROLOGIC:  Alert and oriented x 3 PSYCHIATRIC:  Normal affect   ASSESSMENT:    No diagnosis found. PLAN:    In order of problems listed above:  #Palpitations: Has had intermittent palpitations over the past couple of years. Notably has history of hyperthyroidism but her recent TSH normal ****.  -Check zio monitor  #Chest Heaviness: Not exertional and has been occurring intermittently for several years. Able to exercise regularly without issues. Will risk stratify with Ca score. -Check Ca score  #HLD: -Check Ca score as above       {Are you ordering a CV Procedure (e.g. stress test, cath, DCCV, TEE, etc)?   Press F2        :[id number]}    Medication Adjustments/Labs and Tests Ordered: Current medicines are reviewed at length with the patient today.  Concerns regarding medicines are outlined above.  No orders of the defined types were placed in this encounter.  No orders of the defined types were placed in this encounter.   There are no Patient Instructions on file for this visit.   Signed, Meriam Sprague, MD  01/09/2022 2:39 PM    [facility]

## 2022-01-11 ENCOUNTER — Ambulatory Visit: Payer: 59 | Attending: Cardiology | Admitting: Cardiology

## 2022-01-11 ENCOUNTER — Telehealth: Payer: Self-pay | Admitting: *Deleted

## 2022-01-11 ENCOUNTER — Ambulatory Visit: Payer: 59 | Attending: Cardiology

## 2022-01-11 VITALS — BP 98/78 | HR 72 | Ht 64.0 in | Wt 174.8 lb

## 2022-01-11 DIAGNOSIS — R002 Palpitations: Secondary | ICD-10-CM

## 2022-01-11 DIAGNOSIS — E785 Hyperlipidemia, unspecified: Secondary | ICD-10-CM | POA: Diagnosis not present

## 2022-01-11 DIAGNOSIS — Z8249 Family history of ischemic heart disease and other diseases of the circulatory system: Secondary | ICD-10-CM

## 2022-01-11 NOTE — Progress Notes (Signed)
Cardiology Office Note:    Date:  01/11/2022   ID:  Alexandra Hinton, DOB 1975/03/20, MRN [id number]  PCP:  Robyne Peers, MD   [facility] Providers Cardiologist:  None    Referring MD: Robyne Peers, MD    History of Present Illness:    Alexandra Hinton is a 46 y.o. female with a hx of hyperthyroidism who was referred by Dr. Ruben Gottron for further evaluation of palpitations and chest pain.  Patient seen by Dr. Ruben Gottron on 09/24/21. Reported palpitations and chest heaviness at that visit. Notably was hyperthyroid in the past and was placed on tapazole but had anaphylaxis to the medication and therefore it was stopped. She has had multiple follow-up visits with her Endocrinologist and her thyroid function has been normal.   Today, the patient states that she initially developed palpitations in 02/2019 in the setting of significant stress but these improved with time. They have since recurred and began to worsen several months ago. Specifically, she will heave episodes where her heart will begin racing for several beats and then abate. Symptoms are mainly occurring at rest with no exertional component.  No chest pain or dyspnea. No lightheadedness, dizziness or syncope.  Patient is also noting a difficulty with sleeping and limited caffeine intake. Work can also be stressful.   She describes a "chest heaviness" that occurs in the morning while getting ready for work. No exertional component.  She denies any palpitations, chest pain, shortness of breath, or peripheral edema. No lightheadedness, headaches, syncope, orthopnea, or PND.   Familial History: Maternal uncle with CAD requiring CABG at age 51. Maternal grandfather with CABG in 62s.   Past Medical History:  Diagnosis Date   Fibroids    Headache    Hyperthyroidism    Hyperthyroidism    IBS (irritable bowel syndrome)    no meds. diet controlled   Other chest pain    Palpitations    PONV (postoperative nausea and  vomiting)    Rosacea     Past Surgical History:  Procedure Laterality Date   CHOLECYSTECTOMY     kybella  08/2016   LYSIS OF ADHESION N/A 09/13/2016   Procedure: LYSIS OF ADHESION, CAUTERIZATION OF ENDOMETRIOSIS;  Surgeon: Delsa Bern, MD;  Location: [facility];  Service: Gynecology;  Laterality: N/A;   ROBOT ASSISTED MYOMECTOMY N/A 09/13/2016   Procedure: ROBOTIC ASSISTED MYOMECTOMY with Morcellation;  Surgeon: Delsa Bern, MD;  Location: [facility];  Service: Gynecology;  Laterality: N/A;  power morcellation   robotic myomectomy  12/2011   in [place], [place]   WISDOM TOOTH EXTRACTION      Current Medications: Current Meds  Medication Sig   ACZONE 7.5 % GEL Apply 1 application topically daily as needed. For acne.   Cholecalciferol (VITAMIN D3) 2000 units CHEW Chew 2,500 Units by mouth daily. CHEWABLE   FINACEA 15 % cream Apply 1 application topically daily as needed. For rosacea.   hydrOXYzine (ATARAX/VISTARIL) 25 MG tablet Take 1-2 tablets (25-50 mg total) by mouth every 6 (six) hours as needed for itching (May cause drowsiness.).   Multiple Vitamin (MULTIVITAMIN WITH MINERALS) TABS tablet Take 1 tablet by mouth daily.   nitrofurantoin (MACRODANTIN) 50 MG capsule Take 50 mg by mouth 4 (four) times daily.   olopatadine (PATANOL) 0.1 % ophthalmic solution 1 drop 2 (two) times daily.   omega-3 acid ethyl esters (LOVAZA) 1 g capsule Take by mouth 2 (two) times daily.   Omega-3 Fatty Acids (FISH OIL PO)  Take 2 tablets by mouth daily. CHEWABLES.   triamcinolone cream (KENALOG) 0.1 % Apply 1 application topically daily as needed. For razor burn prevention.     Allergies:   Amoxicillin-pot clavulanate, Cefaclor, Methimazole, Other, Oxycodone-acetaminophen, Sulfamethoxazole-trimethoprim, Tape, Erythromycin, Latex, and Wound dressing adhesive   Social History   Socioeconomic History   Marital status: Divorced    Spouse name: Not on file   Number of children: Not on file   Years of  education: Not on file   Highest education level: Not on file  Occupational History   Not on file  Tobacco Use   Smoking status: Never   Smokeless tobacco: Never  Vaping Use   Vaping Use: Never used  Substance and Sexual Activity   Alcohol use: Yes    Alcohol/week: 2.0 standard drinks of alcohol    Types: 2 Glasses of wine per week   Drug use: No   Sexual activity: Yes    Birth control/protection: Condom  Other Topics Concern   Not on file  Social History Narrative   Not on file   Social Determinants of Health   Financial Resource Strain: Not on file  Food Insecurity: Not on file  Transportation Needs: Not on file  Physical Activity: Not on file  Stress: Not on file  Social Connections: Not on file     Family History: The patient's family history includes Breast cancer in her paternal grandmother; Hyperlipidemia in her father and mother; Hypothyroidism in her mother; Leukemia in her maternal grandfather; Multiple myeloma in her father; Ovarian cancer in her paternal grandmother; Prostate cancer in her maternal grandfather; Stroke in her mother.  ROS:   Review of Systems  Constitutional:  Negative for chills and fever.  HENT:  Negative for nosebleeds and tinnitus.   Eyes:  Negative for blurred vision and pain.  Respiratory:  Negative for cough, hemoptysis, shortness of breath and stridor.   Cardiovascular:  Positive for palpitations (Irregular). Negative for chest pain, orthopnea, claudication, leg swelling and PND.       Chest heaviness   Gastrointestinal:  Negative for blood in stool, diarrhea, nausea and vomiting.  Genitourinary:  Negative for dysuria and hematuria.  Musculoskeletal:  Negative for falls.  Neurological:  Negative for dizziness, loss of consciousness and headaches.  Psychiatric/Behavioral:  Negative for depression, hallucinations and substance abuse. The patient does not have insomnia.      EKGs/Labs/Other Studies Reviewed:    The following studies  were reviewed today: No prior cardiovascular studies available.   EKG:  EKG has been personally reviewed. 01/11/2022: Sinus normal sinus. Rate 72 bpm.  05/16/2019: Sinus rhythm Borderline short PR interval Baseline wander in lead(s) II III aVF No old tracing to compare  (Dr. Tomi Bamberger)  Recent Labs: WBC 4.4 - 11.0 x 10*3/uL 7.9  RBC 4.10 - 5.10 x 10*6/uL 4.51  Hemoglobin 12.3 - 15.3 G/DL 14.4  Hematocrit 35.9 - 44.6 % 41.7  MCV 80.0 - 96.0 FL 92.6  MCH 27.5 - 33.2 PG 31.8  MCHC 33.0 - 37.0 G/DL 34.4  RDW 12.3 - 17.0 % 13.4  Platelets 150 - 450 X 10*3/uL 332  MPV 6.8 - 10.2 FL 8.6  Neutrophil % % 63  Lymphocyte % % 26  Monocyte % % 8  Eosinophil % % 3  Basophil % % 1  Neutrophil Absolute 1.8 - 7.8 x 10*3/uL 5.0  Lymphocyte Absolute 1.0 - 4.8 x 10*3/uL 2.0  Monocyte Absolute 0.0 - 0.8 x 10*3/uL 0.6  Eosinophil Absolute 0.0 -  0.5 x 10*3/uL 0.2  Basophil Absolute 0.0 - 0.2 x 10*3/uL 0.0    Sodium 135 - 146 MMOL/L 138  Potassium 3.5 - 5.3 MMOL/L 4.2  Chloride 98 - 110 MMOL/L 103  CO2 23 - 30 MMOL/L 29  BUN 8 - 24 MG/DL 9  Glucose 70 - 99 MG/DL 87  Comment: Patients taking eltrombopag at doses >/= 100 mg daily may show falsely elevated values of 10% or greater.  Creatinine 0.50 - 1.50 MG/DL 0.89  Calcium 8.5 - 10.5 MG/DL 9.6  Total Protein 6.0 - 8.3 G/DL 7.1  Comment: Patients taking eltrombopag at doses >/= 100 mg daily may show falsely elevated values of 10% or greater.  Albumin 3.5 - 5.0 G/DL 4.3  Total Bilirubin 0.1 - 1.2 MG/DL 0.5  Comment: Patients taking eltrombopag at doses >/= 100 mg daily may show falsely elevated values of 10% or greater.  Alkaline Phosphatase 25 - 125 IU/L or U/L 42  AST (SGOT) 5 - 40 IU/L or U/L 22  ALT (SGPT) 5 - 50 IU/L or U/L 21  Anion Gap 4 - 14 MMOL/L 6  Est. GFR >=60 ML/MIN/1.73 M*2 82    Recent Lipid Panel  Ref Range & Units 3 mo ago  Total Cholesterol 25 - 199 MG/DL 214 High   Triglycerides 10 - 150 MG/DL 82  HDL Cholesterol 35 - 135  MG/DL 53  LDL Cholesterol Calculated 0 - 99 MG/DL 145 High   Total Chol / HDL Cholesterol <4.5 4.0  Non-HDL Cholesterol MG/DL 161    Risk Assessment/Calculations:    Low ASCVD risk 0.5% Risk of cardiovascular event (coronary or stroke death or non-fatal MI or stroke) in next 10 years.  Physical Exam:    VS:  BP 98/78   Pulse 72   Ht _0  (1.626 m)   Wt 174 lb 12.8 oz (79.3 kg)   SpO2 96%   BMI 30.00 kg/m     Wt Readings from Last 3 Encounters:  01/11/22 174 lb 12.8 oz (79.3 kg)  05/16/19 169 lb 4.8 oz (76.8 kg)  05/10/19 154 lb 1.6 oz (69.9 kg)     GEN: Well nourished, well developed in no acute distress HEENT: Normal NECK: No JVD; No carotid bruits CARDIAC: RRR, no murmurs, rubs, gallops RESPIRATORY:  Clear to auscultation without rales, wheezing or rhonchi  ABDOMEN: Soft, non-tender, non-distended MUSCULOSKELETAL:  No edema; No deformity  SKIN: Warm and dry NEUROLOGIC:  Alert and oriented x 3 PSYCHIATRIC:  Normal affect   ASSESSMENT:    1. Palpitations   2. Hyperlipidemia, unspecified hyperlipidemia type   3. Family history of early CAD    PLAN:    In order of problems listed above:  #Palpitations: Has had intermittent palpitations over the past couple of years, more frequent as of recent. Notably has history of hyperthyroidism but her recent TSH normal, does take multiple OTC vitamins including niacin that may contribute to her symptoms. Will check Zio patch for further characterization.   -Check zio monitor x7 days  -Thyroid function has been normal and she follows closely with endocrine -Decrease caffeine intake  #Chest Heaviness: Not exertional and has been occurring intermittently for several years. Able to exercise regularly without issues. Positive familial history of coronary artery disease. Will risk stratify with Ca score. ASCVD 0.5%.  -Check Ca score  #HLD: No medications, taking niacin currently in attempt to lower cholesterol.  -Check Ca  score as above   Follow-up: 1 year   Medication Adjustments/Labs  and Tests Ordered: Current medicines are reviewed at length with the patient today.  Concerns regarding medicines are outlined above.  Orders Placed This Encounter  Procedures   CT CARDIAC SCORING (SELF PAY ONLY)   LONG TERM MONITOR (3-14 DAYS)   EKG 12-Lead   No orders of the defined types were placed in this encounter.  Patient Instructions  Medication Instructions:   Your physician recommends that you continue on your current medications as directed. Please refer to the Current Medication list given to you today.  *If you need a refill on your cardiac medications before your next appointment, please call your pharmacy*   Testing/Procedures:  CARDIAC CALCIUM SCORE (SELF PAY)    [facility] Instructions  Your physician has requested you wear a ZIO patch monitor for 7 days.  This is a single patch monitor. [employer] supplies one patch monitor per enrollment. Additional stickers are not available. Please do not apply patch if you will be having a Nuclear Stress Test,  Echocardiogram, Cardiac CT, MRI, or Chest Xray during the period you would be wearing the  monitor. The patch cannot be worn during these tests. You cannot remove and re-apply the  ZIO XT patch monitor.  Your ZIO patch monitor will be mailed 3 day USPS to your address on file. It may take 3-5 days  to receive your monitor after you have been enrolled.  Once you have received your monitor, please review the enclosed instructions. Your monitor  has already been registered assigning a specific monitor serial # to you.  Billing and Patient Assistance Program Information  We have supplied [employer] with any of your insurance information on file for billing purposes. [employer] offers a sliding scale Patient Assistance Program for patients that do not have  insurance, or whose insurance does not completely cover the cost of the ZIO monitor.   You must apply for the Patient Assistance Program to qualify for this discounted rate.  To apply, please call [employer] at [phone], select option 4, select option 2, ask to apply for  Patient Assistance Program. Theodore Demark will ask your household income, and how many people  are in your household. They will quote your out-of-pocket cost based on that information.  [employer] will also be able to set up a 38-month interest-free payment plan if needed.  Applying the monitor   Shave hair from upper left chest.  Hold abrader disc by orange tab. Rub abrader in 40 strokes over the upper left chest as  indicated in your monitor instructions.  Clean area with 4 enclosed alcohol pads. Let dry.  Apply patch as indicated in monitor instructions. Patch will be placed under collarbone on left  side of chest with arrow pointing upward.  Rub patch adhesive wings for 2 minutes. Remove white label marked "1". Remove the white  label marked "2". Rub patch adhesive wings for 2 additional minutes.  While looking in a mirror, press and release button in center of patch. A small green light will  flash 3-4 times. This will be your only indicator that the monitor has been turned on.  Do not shower for the first 24 hours. You may shower after the first 24 hours.  Press the button if you feel a symptom. You will hear a small click. Record Date, Time and  Symptom in the Patient Logbook.  When you are ready to remove the patch, follow instructions on the last 2 pages of Patient  Logbook. Stick patch monitor onto the  last page of Patient Logbook.  Place Patient Logbook in the blue and white box. Use locking tab on box and tape box closed  securely. The blue and white box has prepaid postage on it. Please place it in the mailbox as  soon as possible. Your physician should have your test results approximately 7 days after the  monitor has been mailed back to [facility].  Call [facility] at  [phone] if you have questions regarding  your ZIO XT patch monitor. Call them immediately if you see an orange light blinking on your  monitor.  If your monitor falls off in less than 4 days, contact our Monitor department at [phone].  If your monitor becomes loose or falls off after 4 days call [employer] at [phone] for  suggestions on securing your monitor    Follow-Up: At [facility], you and your health needs are our priority.  As part of our continuing mission to provide you with exceptional heart care, we have created designated Provider Care Teams.  These Care Teams include your primary Cardiologist (physician) and Advanced Practice Providers (APPs -  Physician Assistants and Nurse Practitioners) who all work together to provide you with the care you need, when you need it.  We recommend signing up for the patient portal called "MyChart".  Sign up information is provided on this After Visit Summary.  MyChart is used to connect with patients for Virtual Visits (Telemedicine).  Patients are able to view lab/test results, encounter notes, upcoming appointments, etc.  Non-urgent messages can be sent to your provider as well.   To learn more about what you can do with MyChart, go to [email or web address].    Your next appointment:   1 year(s)  The format for your next appointment:   In Person  Provider:   DR. Johney Frame   Important Information About Sugar          Signed, Freada Bergeron, MD  01/11/2022 4:59 PM    [place]

## 2022-01-11 NOTE — Telephone Encounter (Signed)
-----   Message from Gerarda Gunther sent at 01/11/2022  5:40 PM EST ----- Regarding: RE: 7 DAY ZIO PER DR. Johney Frame Done ----- Message ----- From: Nuala Alpha, LPN Sent: 51/76/1607   4:39 PM EST To: Nuala Alpha, LPN; Shelly A Wells; # Subject: 7 DAY ZIO PER DR. Johney Frame                    7 day zio ordered for palpitations  Please enroll and let me know   Thanks Airis Barbee

## 2022-01-11 NOTE — Progress Notes (Unsigned)
Enrolled patient for a 7 day Zio XT monitor to be mailed to patients home.  

## 2022-01-11 NOTE — Patient Instructions (Signed)
Medication Instructions:   Your physician recommends that you continue on your current medications as directed. Please refer to the Current Medication list given to you today.  *If you need a refill on your cardiac medications before your next appointment, please call your pharmacy*   Testing/Procedures:  CARDIAC CALCIUM SCORE (SELF PAY)    [place] Instructions  Your physician has requested you wear a ZIO patch monitor for 7 days.  This is a single patch monitor. [employer] supplies one patch monitor per enrollment. Additional stickers are not available. Please do not apply patch if you will be having a Nuclear Stress Test,  Echocardiogram, Cardiac CT, MRI, or Chest Xray during the period you would be wearing the  monitor. The patch cannot be worn during these tests. You cannot remove and re-apply the  ZIO XT patch monitor.  Your ZIO patch monitor will be mailed 3 day USPS to your address on file. It may take 3-5 days  to receive your monitor after you have been enrolled.  Once you have received your monitor, please review the enclosed instructions. Your monitor  has already been registered assigning a specific monitor serial # to you.  Billing and Patient Assistance Program Information  We have supplied [employer] with any of your insurance information on file for billing purposes. [employer] offers a sliding scale Patient Assistance Program for patients that do not have  insurance, or whose insurance does not completely cover the cost of the ZIO monitor.  You must apply for the Patient Assistance Program to qualify for this discounted rate.  To apply, please call [employer] at [phone], select option 4, select option 2, ask to apply for  Patient Assistance Program. Theodore Demark will ask your household income, and how many people  are in your household. They will quote your out-of-pocket cost based on that information.  [employer] will also be able to set up a 25-month  interest-free payment plan if needed.  Applying the monitor   Shave hair from upper left chest.  Hold abrader disc by orange tab. Rub abrader in 40 strokes over the upper left chest as  indicated in your monitor instructions.  Clean area with 4 enclosed alcohol pads. Let dry.  Apply patch as indicated in monitor instructions. Patch will be placed under collarbone on left  side of chest with arrow pointing upward.  Rub patch adhesive wings for 2 minutes. Remove white label marked "1". Remove the white  label marked "2". Rub patch adhesive wings for 2 additional minutes.  While looking in a mirror, press and release button in center of patch. A small green light will  flash 3-4 times. This will be your only indicator that the monitor has been turned on.  Do not shower for the first 24 hours. You may shower after the first 24 hours.  Press the button if you feel a symptom. You will hear a small click. Record Date, Time and  Symptom in the Patient Logbook.  When you are ready to remove the patch, follow instructions on the last 2 pages of Patient  Logbook. Stick patch monitor onto the last page of Patient Logbook.  Place Patient Logbook in the blue and white box. Use locking tab on box and tape box closed  securely. The blue and white box has prepaid postage on it. Please place it in the mailbox as  soon as possible. Your physician should have your test results approximately 7 days after the  monitor has been  mailed back to [facility].  Call [place] at [phone] if you have questions regarding  your ZIO XT patch monitor. Call them immediately if you see an orange light blinking on your  monitor.  If your monitor falls off in less than 4 days, contact our Monitor department at [phone].  If your monitor becomes loose or falls off after 4 days call [employer] at [phone] for  suggestions on securing your monitor    Follow-Up: At [facility],  you and your health needs are our priority.  As part of our continuing mission to provide you with exceptional heart care, we have created designated Provider Care Teams.  These Care Teams include your primary Cardiologist (physician) and Advanced Practice Providers (APPs -  Physician Assistants and Nurse Practitioners) who all work together to provide you with the care you need, when you need it.  We recommend signing up for the patient portal called "MyChart".  Sign up information is provided on this After Visit Summary.  MyChart is used to connect with patients for Virtual Visits (Telemedicine).  Patients are able to view lab/test results, encounter notes, upcoming appointments, etc.  Non-urgent messages can be sent to your provider as well.   To learn more about what you can do with MyChart, go to [email or web address].    Your next appointment:   1 year(s)  The format for your next appointment:   In Person  Provider:   DR. Johney Frame   Important Information About Sugar

## 2022-01-29 DIAGNOSIS — R002 Palpitations: Secondary | ICD-10-CM

## 2022-02-04 ENCOUNTER — Ambulatory Visit (HOSPITAL_BASED_OUTPATIENT_CLINIC_OR_DEPARTMENT_OTHER)
Admission: RE | Admit: 2022-02-04 | Discharge: 2022-02-04 | Disposition: A | Discharge status: Home / Self Care | Payer: 59 | Source: Ambulatory Visit | Attending: Cardiology | Admitting: Cardiology

## 2022-02-04 DIAGNOSIS — E785 Hyperlipidemia, unspecified: Secondary | ICD-10-CM | POA: Insufficient documentation

## 2022-02-15 ENCOUNTER — Telehealth: Payer: Self-pay | Admitting: Cardiology

## 2022-02-15 NOTE — Telephone Encounter (Signed)
Patient returned call for her monitor results.

## 2022-02-15 NOTE — Telephone Encounter (Signed)
See monitor results note./cy

## 2022-02-21 DIAGNOSIS — R519 Headache, unspecified: Secondary | ICD-10-CM

## 2022-02-21 HISTORY — DX: Headache, unspecified: R51.9

## 2022-06-09 ENCOUNTER — Encounter (HOSPITAL_BASED_OUTPATIENT_CLINIC_OR_DEPARTMENT_OTHER): Payer: Self-pay

## 2022-06-09 ENCOUNTER — Ambulatory Visit (HOSPITAL_BASED_OUTPATIENT_CLINIC_OR_DEPARTMENT_OTHER): Admit: 2022-06-09 | Payer: 59 | Admitting: Obstetrics and Gynecology

## 2022-06-09 SURGERY — DILATATION & CURETTAGE/HYSTEROSCOPY WITH MYOSURE
Anesthesia: Choice

## 2022-07-04 ENCOUNTER — Other Ambulatory Visit: Payer: Self-pay | Admitting: Obstetrics and Gynecology

## 2022-07-04 DIAGNOSIS — R928 Other abnormal and inconclusive findings on diagnostic imaging of breast: Secondary | ICD-10-CM

## 2022-07-12 DIAGNOSIS — M26609 Unspecified temporomandibular joint disorder, unspecified side: Secondary | ICD-10-CM | POA: Insufficient documentation

## 2022-07-15 ENCOUNTER — Ambulatory Visit
Admission: RE | Admit: 2022-07-15 | Discharge: 2022-07-15 | Disposition: A | Discharge status: Home / Self Care | Payer: 59 | Source: Ambulatory Visit | Attending: Obstetrics and Gynecology | Admitting: Obstetrics and Gynecology

## 2022-07-15 DIAGNOSIS — R928 Other abnormal and inconclusive findings on diagnostic imaging of breast: Secondary | ICD-10-CM

## 2022-08-26 ENCOUNTER — Encounter (HOSPITAL_BASED_OUTPATIENT_CLINIC_OR_DEPARTMENT_OTHER): Payer: Self-pay | Admitting: Obstetrics and Gynecology

## 2022-08-30 ENCOUNTER — Encounter (HOSPITAL_BASED_OUTPATIENT_CLINIC_OR_DEPARTMENT_OTHER): Payer: Self-pay | Admitting: Obstetrics and Gynecology

## 2022-08-30 ENCOUNTER — Other Ambulatory Visit: Payer: Self-pay

## 2022-08-30 DIAGNOSIS — G8929 Other chronic pain: Secondary | ICD-10-CM | POA: Diagnosis not present

## 2022-08-30 DIAGNOSIS — D259 Leiomyoma of uterus, unspecified: Secondary | ICD-10-CM | POA: Diagnosis not present

## 2022-08-30 DIAGNOSIS — Z01812 Encounter for preprocedural laboratory examination: Secondary | ICD-10-CM | POA: Diagnosis present

## 2022-08-30 DIAGNOSIS — N809 Endometriosis, unspecified: Secondary | ICD-10-CM | POA: Diagnosis not present

## 2022-08-30 DIAGNOSIS — N939 Abnormal uterine and vaginal bleeding, unspecified: Secondary | ICD-10-CM | POA: Diagnosis not present

## 2022-08-30 DIAGNOSIS — R102 Pelvic and perineal pain: Secondary | ICD-10-CM | POA: Diagnosis not present

## 2022-08-30 NOTE — Progress Notes (Signed)
Your procedure is scheduled on Thursday, 09/22/22.  Report to [facility] AT  6:00 AM.   Call this number if you have problems the morning of surgery  :[phone].   OUR ADDRESS IS [address].  WE ARE LOCATED IN [facility].  PLEASE BRING YOUR INSURANCE CARD AND PHOTO ID DAY OF SURGERY.  ONLY 2 PEOPLE ARE ALLOWED IN  WAITING  ROOM                                      REMEMBER:  DO NOT EAT FOOD, CANDY GUM OR MINTS  AFTER MIDNIGHT THE NIGHT BEFORE YOUR SURGERY . YOU MAY HAVE CLEAR LIQUIDS FROM MIDNIGHT THE NIGHT BEFORE YOUR SURGERY UNTIL  5:00 AM. NO CLEAR LIQUIDS AFTER   5:00 AM DAY OF SURGERY.  YOU MAY  BRUSH YOUR TEETH MORNING OF SURGERY AND RINSE YOUR MOUTH OUT, NO CHEWING GUM CANDY OR MINTS.     CLEAR LIQUID DIET    Allowed      Water                                                                   Coffee and tea, regular and decaf  (NO cream or milk products of any type, may sweeten)                         Carbonated beverages, regular and diet                                    Sports drinks like Gatorade _____________________________________________________________________     TAKE ONLY THESE MEDICATIONS MORNING OF SURGERY: NONE                                        DO NOT WEAR JEWERLY/  METAL/  PIERCINGS (INCLUDING NO PLASTIC PIERCINGS) DO NOT WEAR LOTIONS, POWDERS, PERFUMES OR NAIL POLISH ON YOUR FINGERNAILS. TOENAIL POLISH IS OK TO WEAR. DO NOT SHAVE FOR 48 HOURS PRIOR TO DAY OF SURGERY.  CONTACTS, GLASSES, OR DENTURES MAY NOT BE WORN TO SURGERY.  REMEMBER: NO SMOKING, VAPING ,  DRUGS OR ALCOHOL FOR 24 HOURS BEFORE YOUR SURGERY.                                    [place] IS NOT RESPONSIBLE  FOR ANY BELONGINGS.                                                                    Marland Kitchen           [place] - Preparing for Surgery Before surgery,  you can play an important role.  Because skin is not sterile, your  skin needs to be as free of germs as possible.  You can reduce the number of germs on your skin by washing with CHG (chlorahexidine gluconate) soap before surgery.  CHG is an antiseptic cleaner which kills germs and bonds with the skin to continue killing germs even after washing. Please DO NOT use if you have an allergy to CHG or antibacterial soaps.  If your skin becomes reddened/irritated stop using the CHG and inform your nurse when you arrive at Short Stay. Do not shave (including legs and underarms) for at least 48 hours prior to the first CHG shower.  You may shave your face/neck. Please follow these instructions carefully:  1.  Shower with CHG Soap the night before surgery and the  morning of Surgery.  2.  If you choose to wash your hair, wash your hair first as usual with your  normal  shampoo.  3.  After you shampoo, rinse your hair and body thoroughly to remove the  shampoo.                                        4.  Use CHG as you would any other liquid soap.  You can apply chg directly  to the skin and wash , chg soap provided, night before and morning of your surgery.  5.  Apply the CHG Soap to your body ONLY FROM THE NECK DOWN.   Do not use on face/ open                           Wound or open sores. Avoid contact with eyes, ears mouth and genitals (private parts).                       Wash face,  Genitals (private parts) with your normal soap.             6.  Wash thoroughly, paying special attention to the area where your surgery  will be performed.  7.  Thoroughly rinse your body with warm water from the neck down.  8.  DO NOT shower/wash with your normal soap after using and rinsing off  the CHG Soap.             9.  Pat yourself dry with a clean towel.            10.  Wear clean pajamas.            11.  Place clean sheets on your bed the night of your first shower and do not  sleep with pets. Day of Surgery : Do not apply any lotions/ powders the morning of surgery.  Please  wear clean clothes to the hospital/surgery center.  IF YOU HAVE ANY SKIN IRRITATION OR PROBLEMS WITH THE SURGICAL SOAP, PLEASE GET A BAR OF GOLD DIAL SOAP AND SHOWER THE NIGHT BEFORE YOUR SURGERY AND THE MORNING OF YOUR SURGERY. PLEASE LET THE NURSE KNOW MORNING OF YOUR SURGERY IF YOU HAD ANY PROBLEMS WITH THE SURGICAL SOAP.   YOUR SURGEON MAY HAVE REQUESTED EXTENDED RECOVERY TIME AFTER YOUR SURGERY. IT COULD BE A  JUST A FEW HOURS  UP TO AN OVERNIGHT STAY.  YOUR SURGEON SHOULD HAVE DISCUSSED THIS WITH YOU PRIOR TO YOUR SURGERY.  IN THE EVENT YOU NEED TO STAY OVERNIGHT PLEASE REFER TO THE FOLLOWING GUIDELINES. YOU MAY HAVE UP TO 4 VISITORS  MAY VISIT IN THE EXTENDED RECOVERY ROOM UNTIL 800 PM ONLY.  ONE  VISITOR AGE 38 AND OVER MAY SPEND THE NIGHT AND MUST BE IN EXTENDED RECOVERY ROOM NO LATER THAN 800 PM . YOUR DISCHARGE TIME AFTER YOU SPEND THE NIGHT IS 900 AM THE MORNING AFTER YOUR SURGERY. YOU MAY PACK A SMALL OVERNIGHT BAG WITH TOILETRIES FOR YOUR OVERNIGHT STAY IF YOU WISH.  REGARDLESS OF IF YOU STAY OVER NIGHT OR ARE DISCHARGED THE SAME DAY YOU WILL BE REQUIRED TO HAVE A RESPONSIBLE ADULT (18 YRS OLD OR OLDER) STAY WITH YOU FOR AT LEAST THE FIRST 24 HOURS  YOUR PRESCRIPTION MEDICATIONS WILL BE PROVIDED DURING YOUR HOSPITAL STAY.  ________________________________________________________________________                                                        QUESTIONS Alexandra Hinton PRE OP NURSE PHONE [phone].

## 2022-08-30 NOTE — Progress Notes (Signed)
Spoke w/ via phone for pre-op interview---Alexandra Hinton needs dos----urine pregnancy per anesthesia, surgeon orders pending                Hinton results------09/16/22 Hinton appt for cbc, type & screen COVID test -----patient states asymptomatic no test needed Arrive at -------0600 on Thursday, 09/22/2022 NPO after MN NO Solid Food.  Clear liquids from MN until---0500 Med rec completed Medications to take morning of surgery -----none Diabetic medication -----n/a Patient instructed no nail polish to be worn day of surgery Patient instructed to bring photo id and insurance card day of surgery Patient aware to have Driver (ride ) / caregiver    for 24 hours after surgery - mother, Britta Mccreedy Patient Special Instructions -----Extended / overnight stay instructions given. Pre-Op special Instructions -----Requested orders from Dr. Estanislado Pandy via Epic IB on 08/26/2022. Patient verbalized understanding of instructions that were given at this phone interview. Patient denies shortness of breath, chest pain, fever, cough at this phone interview.

## 2022-09-01 NOTE — Progress Notes (Signed)
Patient called in and changed pre-op lab appt from 7/26 to 09/20/22.

## 2022-09-13 NOTE — H&P (Addendum)
Alexandra Hinton is a 47 y.o.  female, P: 0-0-1-0 presents for hysterectomy because of abnormal uterine bleeding, chronic pelvic pain, endometriosis and uterine fibroids. The patient has undergone robot assisted myomectomies in the past (2013 and 2018) however, beginning in 2023 the patient reports that her periods lasted 9 days with the need to change protection hourly. There were clots as big as a softball and cramping rated 8/10 that was minimally relieve with Ibuprofen 800 mg.  Along with this bleeding pattern the she  experienced, pelvic pain 2 weeks out of each month with only minimal relief from Ibuprofen. Earlier this year the patient  began to bleed almost continuously. She was subsequently given Lupron Depot 11.25 mg in April however, bleeding continued and at times was worse. Daily norethindrone 5-10 mg was added to her regimen in June however, it only enabled her to go 2 as opposed to 1 hour before she needed to change her protection. She denies any changes in bowel or bladder function or vaginitis symptoms. A pelvic ultrasound September 13, 2022 revealed: uterine volume = 461 cc, endometrium: 7.87 mm, #3 measurable fibroids (total 8 seen): fundal subserosal- 1. 6.02 cm, left subserosal -4.88 cm and left intramural-3-4.24 cm;uterine dimensions: 10.3 x 7.92 x 11.05 cm left ovary- 3.85 cm and right ovary-2.56 cm. An endometrial biopsy at her pre-operative exam did not show any hyperplasia or evidence of malignancy. The patient was given a review of both medical and surgical management options for her condition however, she wishes to proceed with definitive therapy in the form of hysterectomy.   Past Medical History  OB History  G: 1; P: 0-0-1-0  GYN History: menarche: 47YO;    LMP: see HPI    Contracepton condoms ;  History of abnormal PAP smear in 2002 that was treated with a Loop Electosurgical Excision Procedure-normal since.  Last PAP smear: 2023-normal  Medical History: Factor V Leiden, Thyroid  Disease, Migraine, Uterine Fibroids and Endometriosis   Surgical History: 2002: Loop Electrosurgical Excision Procedure; 2013: Robot Assisted Myomectomy; 2018: Robot Assisted Myomectomy and 2021 Cholecystectomy. 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; Denies tobacco use and occasionally uses alcohol   Medications: none and has stopped all supplements  Allergies  Allergen Reactions   Amoxicillin-Pot Clavulanate Hives and Shortness Of Breath    Has patient had a PCN reaction causing immediate rash, facial/tongue/throat swelling, SOB or lightheadedness with hypotension: Yes Has patient had a PCN reaction causing severe rash involving mucus membranes or skin necrosis: Yes Has patient had a PCN reaction that required hospitalization: No Has patient had a PCN reaction occurring within the last 10 years: No If all of the above answers are "NO", then may proceed with Cephalosporin use.    Cefaclor Anaphylaxis, Rash, Shortness Of Breath and Hives   Methimazole Hives, Anaphylaxis and Other (See Comments)   Other Rash   Oxycodone-Acetaminophen Other (See Comments)    Migraines and nightmares   Sulfamethoxazole-Trimethoprim Hives and Shortness Of Breath   Tape Rash    derma bond allergy  PATIENT TOLERATES PAPER TAPER    Erythromycin Diarrhea and Nausea And Vomiting   Latex Rash   Wound Dressing Adhesive Dermatitis    Tolerates Paper Tape without problem  Same Reaction with DERMABOND    ROS: Admits to reading glasses and rare migraines but  denies vision changes, nasal congestion, dysphagia, tinnitus, dizziness, hoarseness, cough,  chest pain, shortness of breath, nausea, vomiting, diarrhea,constipation,  urinary frequency, urgency  dysuria, hematuria, vaginitis  symptoms, swelling of joints,easy bruising,  myalgias, arthralgias, skin rashes, unexplained weight loss and except as is mentioned in the history of present illness, patient's review of systems is otherwise negative.    Physical Exam  Bp: 128/84;  Height: 5'4"' Weight: 170.4 lbs.  BMI:29.2  Neck: supple without masses or thyromegaly Lungs: clear to auscultation Heart: regular rate and rhythm Abdomen: soft, diffuse tenderness in both lower quadrants and no organomegaly Pelvic:EGBUS- wnl; vagina-normal rugae with moderate blood; uterus- 10-12 weeks size, irregular, cervix without lesions or motion tenderness; adnexae-no tenderness or masses Extremities:  no clubbing, cyanosis or edema   Assesment: Abnormal Uterine Bleeding                      Endometriosis                      Chronic Pelvic Pain                      Uterine Fibroids   Disposition: The robot-assisted hysterectomy was reviewed with the patient along with the indication for her procedure. Benefits of the robotic approach include lesser postoperative pain, less blood loss during surgery, reduced risk of injury to other organs, due to better visualization with a 3-D HD 10 times magnifying camera; shorter hospital stay between 0-1 night and rapid recovery with return to daily routine in 2-3 weeks (however, nothing in the vagina for 6 weeks). Although the robot-assisted hysterectomy has a longer operative time than traditional laparotomy, the benefits usually outweigh the risks, in a patient with good medical history,   Risks include bleeding, infection, injury to other organs (bladder more vulnerable if previous cesarean delivery) , need for laparotomy, transient post-operative facial edema, increased risk of pelvic prolapse (associated with any hysterectomy) as well as earlier onset of menopause. Preservation of the ovaries was also reviewed and recommended however the patient wishes to have one or both of her ovaries removed  should they appear diseased. Finally, we recommended bilateral salpingectomy for lifelong reduction of ovarian cancer. A Miralax Bowel Prep was given to the patient to complete the day before her surgery.  The patient's questions were answered and she verbalized understanding of the risks and pre-operative instructions. The patient has consented to proceed with a Robot Assisted Laparoscopic Hysterectomy with Bilateral Salpingectomy  and Possible Unilateral or Bilateral Oophorectomy at [facility] on September 22, 2022.      CSN# [id number]   Destyni Hoppel J. Lowell Guitar, PA-C  for Dr. [name] A Rivard

## 2022-09-16 ENCOUNTER — Encounter (HOSPITAL_COMMUNITY): Payer: 59

## 2022-09-20 ENCOUNTER — Encounter (HOSPITAL_COMMUNITY)
Admission: RE | Admit: 2022-09-20 | Discharge: 2022-09-20 | Disposition: A | Discharge status: Home / Self Care | Payer: 59 | Source: Ambulatory Visit | Attending: Obstetrics and Gynecology | Admitting: Obstetrics and Gynecology

## 2022-09-20 VITALS — BP 117/79 | HR 77 | Temp 98.5°F | Resp 18 | Ht 64.0 in | Wt 165.0 lb

## 2022-09-20 DIAGNOSIS — R102 Pelvic and perineal pain: Secondary | ICD-10-CM | POA: Insufficient documentation

## 2022-09-20 DIAGNOSIS — Z01812 Encounter for preprocedural laboratory examination: Secondary | ICD-10-CM | POA: Insufficient documentation

## 2022-09-20 DIAGNOSIS — N939 Abnormal uterine and vaginal bleeding, unspecified: Secondary | ICD-10-CM

## 2022-09-20 DIAGNOSIS — D259 Leiomyoma of uterus, unspecified: Secondary | ICD-10-CM

## 2022-09-20 DIAGNOSIS — G8929 Other chronic pain: Secondary | ICD-10-CM

## 2022-09-20 DIAGNOSIS — N809 Endometriosis, unspecified: Secondary | ICD-10-CM

## 2022-09-20 DIAGNOSIS — Z01818 Encounter for other preprocedural examination: Secondary | ICD-10-CM

## 2022-09-20 LAB — TYPE AND SCREEN
ABO/RH(D): O POS
Antibody Screen: NEGATIVE

## 2022-09-20 LAB — BASIC METABOLIC PANEL
Anion gap: 7 (ref 5–15)
BUN: 12 mg/dL (ref 6–20)
CO2: 26 mmol/L (ref 22–32)
Calcium: 9.1 mg/dL (ref 8.9–10.3)
Chloride: 105 mmol/L (ref 98–111)
Creatinine, Ser: 0.92 mg/dL (ref 0.44–1.00)
GFR, Estimated: >60 mL/min (ref >60)
Glucose, Bld: 94 mg/dL (ref 70–99)
Potassium: 4 mmol/L (ref 3.5–5.1)
Sodium: 138 mmol/L (ref 135–145)

## 2022-09-20 LAB — CBC
HCT: 41.1 % (ref 36.0–46.0)
Hemoglobin: 13.3 g/dL (ref 12.0–15.0)
MCH: 30.2 pg (ref 26.0–34.0)
MCHC: 32.4 g/dL (ref 30.0–36.0)
MCV: 93.2 fL (ref 80.0–100.0)
Platelets: 346 10*3/uL (ref 150–400)
RBC: 4.41 MIL/uL (ref 3.87–5.11)
RDW: 14.6 % (ref 11.5–15.5)
WBC: 8.1 10*3/uL (ref 4.0–10.5)
nRBC: 0 % (ref 0.0–0.2)

## 2022-09-20 NOTE — Progress Notes (Signed)
Patient called in and ask if it was okay to have on toenail polish the day of surgery. I told her that toenail polish is fine.

## 2022-09-21 MED ORDER — GENTAMICIN SULFATE 40 MG/ML IJ SOLN
5.0000 mg/kg | INTRAVENOUS | Status: AC
  Administered 2022-09-22: 300 mg via INTRAVENOUS
  Filled 2022-09-21: qty 7.5

## 2022-09-21 MED ORDER — CLINDAMYCIN PHOSPHATE 900 MG/50ML IV SOLN
900.0000 mg | INTRAVENOUS | Status: AC
  Administered 2022-09-22: 900 mg via INTRAVENOUS

## 2022-09-21 NOTE — Anesthesia Preprocedure Evaluation (Signed)
Anesthesia Evaluation  Patient identified by MRN, date of birth, ID band Patient awake    Reviewed: Allergy & Precautions, H&P , NPO status , Patient's Chart, lab work & pertinent test results  History of Anesthesia Complications (+) PONV and history of anesthetic complications  Airway Mallampati: III  TM Distance: >3 FB Neck ROM: Full    Dental no notable dental hx. (+) Teeth Intact   Pulmonary neg pulmonary ROS   Pulmonary exam normal breath sounds clear to auscultation       Cardiovascular Exercise Tolerance: Good negative cardio ROS  Rhythm:Regular Rate:Normal     Neuro/Psych  Headaches  negative psych ROS   GI/Hepatic Neg liver ROS,GERD  ,,  Endo/Other  negative endocrine ROS    Renal/GU negative Renal ROS  negative genitourinary   Musculoskeletal   Abdominal   Peds  Hematology negative hematology ROS (+)   Anesthesia Other Findings   Reproductive/Obstetrics negative OB ROS                             Anesthesia Physical Anesthesia Plan  ASA: 2  Anesthesia Plan: General   Post-op Pain Management: Tylenol PO (pre-op)* and Toradol IV (intra-op)*   Induction: Intravenous  PONV Risk Score and Plan: 4 or greater and Ondansetron, Dexamethasone and Midazolam  Airway Management Planned: Oral ETT  Additional Equipment:   Intra-op Plan:   Post-operative Plan: Extubation in OR  Informed Consent: I have reviewed the patients History and Physical, chart, labs and discussed the procedure including the risks, benefits and alternatives for the proposed anesthesia with the patient or authorized representative who has indicated his/her understanding and acceptance.     Dental advisory given  Plan Discussed with: CRNA  Anesthesia Plan Comments:        Anesthesia Quick Evaluation

## 2022-09-22 ENCOUNTER — Ambulatory Visit (HOSPITAL_BASED_OUTPATIENT_CLINIC_OR_DEPARTMENT_OTHER): Payer: 59 | Admitting: Anesthesiology

## 2022-09-22 ENCOUNTER — Encounter (HOSPITAL_BASED_OUTPATIENT_CLINIC_OR_DEPARTMENT_OTHER)
Admission: RE | Disposition: A | Discharge status: Home / Self Care | Payer: Self-pay | Source: Home / Self Care | Attending: Obstetrics and Gynecology

## 2022-09-22 ENCOUNTER — Other Ambulatory Visit: Payer: Self-pay

## 2022-09-22 ENCOUNTER — Encounter (HOSPITAL_BASED_OUTPATIENT_CLINIC_OR_DEPARTMENT_OTHER): Payer: Self-pay | Admitting: Obstetrics and Gynecology

## 2022-09-22 ENCOUNTER — Ambulatory Visit (HOSPITAL_BASED_OUTPATIENT_CLINIC_OR_DEPARTMENT_OTHER)
Admission: RE | Admit: 2022-09-22 | Discharge: 2022-09-22 | Disposition: A | Discharge status: Home / Self Care | Payer: 59 | Attending: Obstetrics and Gynecology | Admitting: Obstetrics and Gynecology

## 2022-09-22 DIAGNOSIS — N8003 Adenomyosis of the uterus: Secondary | ICD-10-CM | POA: Diagnosis not present

## 2022-09-22 DIAGNOSIS — D251 Intramural leiomyoma of uterus: Secondary | ICD-10-CM | POA: Insufficient documentation

## 2022-09-22 DIAGNOSIS — N939 Abnormal uterine and vaginal bleeding, unspecified: Secondary | ICD-10-CM | POA: Diagnosis not present

## 2022-09-22 DIAGNOSIS — D259 Leiomyoma of uterus, unspecified: Secondary | ICD-10-CM | POA: Diagnosis present

## 2022-09-22 DIAGNOSIS — N809 Endometriosis, unspecified: Secondary | ICD-10-CM

## 2022-09-22 DIAGNOSIS — D252 Subserosal leiomyoma of uterus: Secondary | ICD-10-CM | POA: Diagnosis not present

## 2022-09-22 DIAGNOSIS — G8929 Other chronic pain: Secondary | ICD-10-CM | POA: Insufficient documentation

## 2022-09-22 DIAGNOSIS — R102 Pelvic and perineal pain: Secondary | ICD-10-CM | POA: Diagnosis not present

## 2022-09-22 DIAGNOSIS — Z01818 Encounter for other preprocedural examination: Secondary | ICD-10-CM

## 2022-09-22 HISTORY — DX: Gastro-esophageal reflux disease without esophagitis: K21.9

## 2022-09-22 HISTORY — DX: Activated protein C resistance: D68.51

## 2022-09-22 HISTORY — PX: ROBOTIC ASSISTED LAPAROSCOPIC HYSTERECTOMY AND SALPINGECTOMY: SHX6379

## 2022-09-22 LAB — POCT PREGNANCY, URINE: Preg Test, Ur: NEGATIVE

## 2022-09-22 SURGERY — XI ROBOTIC ASSISTED LAPAROSCOPIC HYSTERECTOMY AND SALPINGECTOMY
Anesthesia: General | Site: Uterus | Laterality: Bilateral

## 2022-09-22 MED ORDER — MIDAZOLAM HCL 5 MG/5ML IJ SOLN
INTRAMUSCULAR | Status: DC | PRN
  Administered 2022-09-22: 2 mg via INTRAVENOUS

## 2022-09-22 MED ORDER — LACTATED RINGERS IV SOLN: INTRAVENOUS | Status: DC

## 2022-09-22 MED ORDER — HYDROMORPHONE HCL 1 MG/ML IJ SOLN
INTRAMUSCULAR | Status: AC
  Filled 2022-09-22: qty 1

## 2022-09-22 MED ORDER — DOCUSATE SODIUM 100 MG PO CAPS
ORAL_CAPSULE | ORAL | Status: AC
  Filled 2022-09-22: qty 1

## 2022-09-22 MED ORDER — SCOPOLAMINE 1 MG/3DAYS TD PT72
MEDICATED_PATCH | TRANSDERMAL | Status: AC
  Filled 2022-09-22: qty 1

## 2022-09-22 MED ORDER — SIMETHICONE 80 MG PO CHEW: 80.0000 mg | CHEWABLE_TABLET | Freq: Four times a day (QID) | ORAL | Status: DC | PRN

## 2022-09-22 MED ORDER — PHENYLEPHRINE HCL (PRESSORS) 10 MG/ML IV SOLN
INTRAVENOUS | Status: AC
  Filled 2022-09-22: qty 1

## 2022-09-22 MED ORDER — GABAPENTIN 300 MG PO CAPS
300.0000 mg | ORAL_CAPSULE | ORAL | Status: AC
  Administered 2022-09-22: 300 mg via ORAL

## 2022-09-22 MED ORDER — ACETAMINOPHEN 500 MG PO TABS: ORAL_TABLET | ORAL | 0 refills | Status: AC

## 2022-09-22 MED ORDER — IBUPROFEN 200 MG PO TABS
ORAL_TABLET | ORAL | Status: AC
  Filled 2022-09-22: qty 3

## 2022-09-22 MED ORDER — KETOROLAC TROMETHAMINE 15 MG/ML IJ SOLN: 15.0000 mg | INTRAMUSCULAR | Status: DC

## 2022-09-22 MED ORDER — PROPOFOL 10 MG/ML IV BOLUS
INTRAVENOUS | Status: DC | PRN
  Administered 2022-09-22: 120 mg via INTRAVENOUS

## 2022-09-22 MED ORDER — GLYCOPYRROLATE PF 0.2 MG/ML IJ SOSY
PREFILLED_SYRINGE | INTRAMUSCULAR | Status: AC
  Filled 2022-09-22: qty 1

## 2022-09-22 MED ORDER — SUGAMMADEX SODIUM 200 MG/2ML IV SOLN
INTRAVENOUS | Status: DC | PRN
  Administered 2022-09-22: 200 mg via INTRAVENOUS

## 2022-09-22 MED ORDER — SODIUM CHLORIDE 0.9 % IR SOLN
Status: DC | PRN
  Administered 2022-09-22: 200 mL

## 2022-09-22 MED ORDER — IBUPROFEN 200 MG PO TABS
600.0000 mg | ORAL_TABLET | Freq: Four times a day (QID) | ORAL | Status: DC
  Administered 2022-09-22: 600 mg via ORAL

## 2022-09-22 MED ORDER — DEXAMETHASONE SODIUM PHOSPHATE 10 MG/ML IJ SOLN
INTRAMUSCULAR | Status: DC | PRN
  Administered 2022-09-22: 10 mg via INTRAVENOUS

## 2022-09-22 MED ORDER — ONDANSETRON HCL 4 MG/2ML IJ SOLN: 4.0000 mg | Freq: Four times a day (QID) | INTRAMUSCULAR | Status: DC | PRN

## 2022-09-22 MED ORDER — ACETAMINOPHEN 500 MG PO TABS
ORAL_TABLET | ORAL | Status: AC
  Filled 2022-09-22: qty 2

## 2022-09-22 MED ORDER — IBUPROFEN 600 MG PO TABS
ORAL_TABLET | ORAL | 1 refills | Status: AC
Start: 2022-09-22 — End: ?

## 2022-09-22 MED ORDER — PHENAZOPYRIDINE HCL 100 MG PO TABS
200.0000 mg | ORAL_TABLET | Freq: Three times a day (TID) | ORAL | Status: DC | PRN
  Administered 2022-09-22: 200 mg via ORAL

## 2022-09-22 MED ORDER — ENSURE PRE-SURGERY PO LIQD: 592.0000 mL | Freq: Once | ORAL | Status: DC

## 2022-09-22 MED ORDER — ONDANSETRON HCL 4 MG/2ML IJ SOLN
INTRAMUSCULAR | Status: DC | PRN
  Administered 2022-09-22: 4 mg via INTRAVENOUS

## 2022-09-22 MED ORDER — POVIDONE-IODINE 10 % EX SWAB: 2.0000 | Freq: Once | CUTANEOUS | Status: DC

## 2022-09-22 MED ORDER — ONDANSETRON HCL 4 MG PO TABS: 4.0000 mg | ORAL_TABLET | Freq: Four times a day (QID) | ORAL | Status: DC | PRN

## 2022-09-22 MED ORDER — DEXMEDETOMIDINE HCL IN NACL 80 MCG/20ML IV SOLN
INTRAVENOUS | Status: AC
  Filled 2022-09-22: qty 20

## 2022-09-22 MED ORDER — ENSURE PRE-SURGERY PO LIQD: 296.0000 mL | Freq: Once | ORAL | Status: DC

## 2022-09-22 MED ORDER — ONDANSETRON HCL 4 MG/2ML IJ SOLN
INTRAMUSCULAR | Status: AC
  Filled 2022-09-22: qty 2

## 2022-09-22 MED ORDER — LIDOCAINE HCL (PF) 2 % IJ SOLN
INTRAMUSCULAR | Status: AC
  Filled 2022-09-22: qty 5

## 2022-09-22 MED ORDER — FENTANYL CITRATE (PF) 100 MCG/2ML IJ SOLN
INTRAMUSCULAR | Status: DC | PRN
  Administered 2022-09-22: 100 ug via INTRAVENOUS
  Administered 2022-09-22: 25 ug via INTRAVENOUS

## 2022-09-22 MED ORDER — SODIUM CHLORIDE (PF) 0.9 % IJ SOLN
INTRAMUSCULAR | Status: AC
  Filled 2022-09-22: qty 10

## 2022-09-22 MED ORDER — PHENYLEPHRINE 80 MCG/ML (10ML) SYRINGE FOR IV PUSH (FOR BLOOD PRESSURE SUPPORT)
PREFILLED_SYRINGE | INTRAVENOUS | Status: AC
  Filled 2022-09-22: qty 10

## 2022-09-22 MED ORDER — FENTANYL CITRATE (PF) 100 MCG/2ML IJ SOLN
INTRAMUSCULAR | Status: AC
  Filled 2022-09-22: qty 2

## 2022-09-22 MED ORDER — DEXMEDETOMIDINE HCL IN NACL 80 MCG/20ML IV SOLN
INTRAVENOUS | Status: DC | PRN
  Administered 2022-09-22: 8 ug via INTRAVENOUS

## 2022-09-22 MED ORDER — GLYCOPYRROLATE 0.2 MG/ML IJ SOLN
INTRAMUSCULAR | Status: DC | PRN
  Administered 2022-09-22: .2 mg via INTRAVENOUS

## 2022-09-22 MED ORDER — DOCUSATE SODIUM 100 MG PO CAPS
100.0000 mg | ORAL_CAPSULE | Freq: Two times a day (BID) | ORAL | Status: DC
  Administered 2022-09-22: 100 mg via ORAL

## 2022-09-22 MED ORDER — SCOPOLAMINE 1 MG/3DAYS TD PT72
1.0000 | MEDICATED_PATCH | TRANSDERMAL | Status: DC
  Administered 2022-09-22: 1.5 mg via TRANSDERMAL

## 2022-09-22 MED ORDER — TRAMADOL HCL 50 MG PO TABS: 50.0000 mg | ORAL_TABLET | Freq: Four times a day (QID) | ORAL | 0 refills | Status: AC | PRN

## 2022-09-22 MED ORDER — PHENAZOPYRIDINE HCL 100 MG PO TABS
ORAL_TABLET | ORAL | Status: AC
  Filled 2022-09-22: qty 2

## 2022-09-22 MED ORDER — TRIAMCINOLONE ACETONIDE 40 MG/ML IJ SUSP
INTRAMUSCULAR | Status: DC | PRN
  Administered 2022-09-22: 40 mg

## 2022-09-22 MED ORDER — PANTOPRAZOLE SODIUM 40 MG PO TBEC
DELAYED_RELEASE_TABLET | ORAL | Status: AC
  Filled 2022-09-22: qty 1

## 2022-09-22 MED ORDER — PHENYLEPHRINE HCL-NACL 20-0.9 MG/250ML-% IV SOLN
INTRAVENOUS | Status: DC | PRN
  Administered 2022-09-22: 20 ug/min via INTRAVENOUS

## 2022-09-22 MED ORDER — LIDOCAINE 2% (20 MG/ML) 5 ML SYRINGE
INTRAMUSCULAR | Status: DC | PRN
  Administered 2022-09-22: 60 mg via INTRAVENOUS

## 2022-09-22 MED ORDER — MIDAZOLAM HCL 2 MG/2ML IJ SOLN
INTRAMUSCULAR | Status: AC
  Filled 2022-09-22: qty 2

## 2022-09-22 MED ORDER — CLINDAMYCIN PHOSPHATE 900 MG/50ML IV SOLN
INTRAVENOUS | Status: AC
  Filled 2022-09-22: qty 50

## 2022-09-22 MED ORDER — ROPIVACAINE HCL 5 MG/ML IJ SOLN: INTRAMUSCULAR | Status: DC | PRN

## 2022-09-22 MED ORDER — TRAMADOL HCL 50 MG PO TABS
50.0000 mg | ORAL_TABLET | Freq: Four times a day (QID) | ORAL | Status: DC | PRN
  Administered 2022-09-22: 50 mg via ORAL

## 2022-09-22 MED ORDER — SODIUM CHLORIDE 0.9 % IV SOLN
INTRAVENOUS | Status: DC | PRN
  Administered 2022-09-22: 10 mL
  Administered 2022-09-22: 47 mL
  Administered 2022-09-22: 60 mL

## 2022-09-22 MED ORDER — PHENYLEPHRINE 80 MCG/ML (10ML) SYRINGE FOR IV PUSH (FOR BLOOD PRESSURE SUPPORT)
PREFILLED_SYRINGE | INTRAVENOUS | Status: DC | PRN
  Administered 2022-09-22: 80 ug via INTRAVENOUS
  Administered 2022-09-22: 120 ug via INTRAVENOUS
  Administered 2022-09-22: 160 ug via INTRAVENOUS

## 2022-09-22 MED ORDER — TRAMADOL HCL 50 MG PO TABS
ORAL_TABLET | ORAL | Status: AC
  Filled 2022-09-22: qty 1

## 2022-09-22 MED ORDER — ACETAMINOPHEN 500 MG PO TABS
1000.0000 mg | ORAL_TABLET | ORAL | Status: AC
  Administered 2022-09-22: 1000 mg via ORAL

## 2022-09-22 MED ORDER — HYDROMORPHONE HCL 1 MG/ML IJ SOLN
0.2500 mg | INTRAMUSCULAR | Status: DC | PRN
  Administered 2022-09-22: 0.25 mg via INTRAVENOUS

## 2022-09-22 MED ORDER — MENTHOL 3 MG MT LOZG: 1.0000 | LOZENGE | OROMUCOSAL | Status: DC | PRN

## 2022-09-22 MED ORDER — DEXAMETHASONE SODIUM PHOSPHATE 10 MG/ML IJ SOLN
INTRAMUSCULAR | Status: AC
  Filled 2022-09-22: qty 1

## 2022-09-22 MED ORDER — GABAPENTIN 300 MG PO CAPS
ORAL_CAPSULE | ORAL | Status: AC
  Filled 2022-09-22: qty 1

## 2022-09-22 MED ORDER — ROCURONIUM BROMIDE 10 MG/ML (PF) SYRINGE
PREFILLED_SYRINGE | INTRAVENOUS | Status: AC
  Filled 2022-09-22: qty 10

## 2022-09-22 MED ORDER — PANTOPRAZOLE SODIUM 40 MG PO TBEC
40.0000 mg | DELAYED_RELEASE_TABLET | Freq: Every day | ORAL | Status: DC
  Administered 2022-09-22: 40 mg via ORAL

## 2022-09-22 MED ORDER — PROPOFOL 10 MG/ML IV BOLUS
INTRAVENOUS | Status: AC
  Filled 2022-09-22: qty 20

## 2022-09-22 MED ORDER — ROCURONIUM BROMIDE 10 MG/ML (PF) SYRINGE
PREFILLED_SYRINGE | INTRAVENOUS | Status: DC | PRN
  Administered 2022-09-22: 50 mg via INTRAVENOUS
  Administered 2022-09-22: 10 mg via INTRAVENOUS
  Administered 2022-09-22: 20 mg via INTRAVENOUS

## 2022-09-22 MED ORDER — ACETAMINOPHEN 500 MG PO TABS
1000.0000 mg | ORAL_TABLET | Freq: Four times a day (QID) | ORAL | Status: DC
  Administered 2022-09-22: 1000 mg via ORAL

## 2022-09-22 SURGICAL SUPPLY — 63 items
BAG DRN RND TRDRP ANRFLXCHMBR (UROLOGICAL SUPPLIES) ×1
BAG URINE DRAIN 2000ML AR STRL (UROLOGICAL SUPPLIES) ×1 IMPLANT
BARRIER ADHS 3X4 INTERCEED (GAUZE/BANDAGES/DRESSINGS) IMPLANT
BRR ADH 4X3 ABS CNTRL BYND (GAUZE/BANDAGES/DRESSINGS) ×1
COVER BACK TABLE 60X90IN (DRAPES) ×1 IMPLANT
COVER PROBE U/S 5X48 (MISCELLANEOUS) IMPLANT
COVER TIP SHEARS 8 DVNC (MISCELLANEOUS) ×1 IMPLANT
DEFOGGER SCOPE WARMER CLEARIFY (MISCELLANEOUS) ×1 IMPLANT
DRAPE ARM DVNC X/XI (DISPOSABLE) ×4 IMPLANT
DRAPE COLUMN DVNC XI (DISPOSABLE) ×1 IMPLANT
DRAPE SURG IRRIG POUCH 19X23 (DRAPES) ×1 IMPLANT
DRAPE UTILITY XL STRL (DRAPES) ×1 IMPLANT
DRIVER NDL MEGA SUTCUT DVNCXI (INSTRUMENTS) ×1 IMPLANT
DRIVER NDLE MEGA SUTCUT DVNCXI (INSTRUMENTS) ×2
DRSG COVADERM PLUS 2X2 (GAUZE/BANDAGES/DRESSINGS) IMPLANT
DRSG OPSITE POSTOP 3X4 (GAUZE/BANDAGES/DRESSINGS) IMPLANT
DURAPREP 26ML APPLICATOR (WOUND CARE) ×1 IMPLANT
ELECT REM PT RETURN 9FT ADLT (ELECTROSURGICAL) ×1
ELECTRODE REM PT RTRN 9FT ADLT (ELECTROSURGICAL) ×1 IMPLANT
FORCEPS BPLR LNG DVNC XI (INSTRUMENTS) ×1 IMPLANT
FORCEPS LONG TIP 8 DVNC XI (FORCEP) ×1 IMPLANT
FORCEPS PROGRASP DVNC XI (FORCEP) ×1 IMPLANT
FORCEPS TENACULUM DVNC XI (FORCEP) IMPLANT
GAUZE 4X4 16PLY ~~LOC~~+RFID DBL (SPONGE) IMPLANT
GAUZE PETROLATUM 1 X8 (GAUZE/BANDAGES/DRESSINGS) ×1 IMPLANT
GLOVE BIOGEL PI IND STRL 7.0 (GLOVE) ×3 IMPLANT
GLOVE ECLIPSE 6.5 STRL STRAW (GLOVE) ×3 IMPLANT
GLOVE SURG SS PI 6.5 STRL IVOR (GLOVE) IMPLANT
HOLDER FOLEY CATH W/STRAP (MISCELLANEOUS) IMPLANT
IRRIG SUCT STRYKERFLOW 2 WTIP (MISCELLANEOUS) ×1
IRRIGATION SUCT STRKRFLW 2 WTP (MISCELLANEOUS) ×1 IMPLANT
IV NS 1000ML (IV SOLUTION) ×2
IV NS 1000ML BAXH (IV SOLUTION) IMPLANT
KIT PINK PAD W/HEAD ARE REST (MISCELLANEOUS) ×1
KIT PINK PAD W/HEAD ARM REST (MISCELLANEOUS) ×1 IMPLANT
KIT TURNOVER CYSTO (KITS) ×1 IMPLANT
LEGGING LITHOTOMY PAIR STRL (DRAPES) ×1 IMPLANT
NDL HYPO 22X1.5 SAFETY MO (MISCELLANEOUS) ×1 IMPLANT
NEEDLE HYPO 22X1.5 SAFETY MO (MISCELLANEOUS) ×1
OBTURATOR OPTICAL STND 8 DVNC (TROCAR) ×1
OBTURATOR OPTICALSTD 8 DVNC (TROCAR) ×1 IMPLANT
OCCLUDER COLPOPNEUMO (BALLOONS) ×1 IMPLANT
PACK ROBOT WH (CUSTOM PROCEDURE TRAY) ×1 IMPLANT
PACK ROBOTIC GOWN (GOWN DISPOSABLE) ×1 IMPLANT
PAD OB MATERNITY 4.3X12.25 (PERSONAL CARE ITEMS) ×1 IMPLANT
PAD PREP 24X48 CUFFED NSTRL (MISCELLANEOUS) ×1 IMPLANT
POUCH LAPAROSCOPIC INSTRUMENT (MISCELLANEOUS) ×1 IMPLANT
PROTECTOR NERVE ULNAR (MISCELLANEOUS) ×3 IMPLANT
SCISSORS MNPLR CVD DVNC XI (INSTRUMENTS) ×1 IMPLANT
SEAL UNIV 5-12 XI (MISCELLANEOUS) ×4 IMPLANT
SEALER VESSEL EXT DVNC XI (MISCELLANEOUS) IMPLANT
SET TRI-LUMEN FLTR TB AIRSEAL (TUBING) ×1 IMPLANT
SLEEVE SCD COMPRESS KNEE MED (STOCKING) ×1 IMPLANT
STRIP CLOSURE SKIN 1/4X4 (GAUZE/BANDAGES/DRESSINGS) ×1 IMPLANT
SUT MNCRL AB 3-0 PS2 27 (SUTURE) ×2 IMPLANT
SUT VIC AB 0 CT1 27 (SUTURE) ×2
SUT VIC AB 0 CT1 27XBRD ANBCTR (SUTURE) ×2 IMPLANT
SUT VICRYL 0 UR6 27IN ABS (SUTURE) ×1 IMPLANT
SUT VLOC 180 0 9IN GS21 (SUTURE) ×2 IMPLANT
TIP UTERINE 6.7X10CM GRN DISP (MISCELLANEOUS) IMPLANT
TOWEL OR 17X24 6PK STRL BLUE (TOWEL DISPOSABLE) ×1 IMPLANT
TRAY FOLEY SLVR 16FR LF STAT (SET/KITS/TRAYS/PACK) IMPLANT
TROCAR PORT AIRSEAL 8X120 (TROCAR) ×1 IMPLANT

## 2022-09-22 NOTE — Op Note (Signed)
Preoperative diagnosis: Abnormal uterine bleeding, uterine fibroids  Postoperative diagnosis: Same   Anesthesia: General  Anesthesiologist: Dr. Ace Gins  Procedure: Robotically assisted total hysterectomy with bilateral salpingectomy  Surgeon: Dr. [name] Ram Haugan  Assistant: Henreitta Leber P.A.-C.  Estimated blood loss: 25 cc  Procedure:  After being informed of the planned procedure with possible complications including but not limited to bleeding, infection, injury to other organs, need for laparotomy, expected hospital stay and recovery, informed consent is obtained and patient is taken to or #5. She is placed in  lithotomy position on Pink Pad with both arms padded and tucked on each side and bilateral knee-high sequential compressive devices. She is given general anesthesia with endotracheal intubation without any complication. She is prepped and draped in a sterile fashion. A three-way Foley catheter is inserted in her bladder.  Pelvic exam reveals: enlarged irregular uterus of 12-14 week size  A weighted speculum is inserted in the vagina and the anterior lip of the cervix is grasped with a tenaculum forcep. We proceed with a paracervical block and vaginal infiltration using ropivacaine 0.5% diluted 1 in 1 with saline. The uterus was then sounded at 12 cm. We easily dilate the cervix using Hegar dilator to  #27 which allows for easy placement of the intrauterine RUMI manipulator with a 3.5 KOH ring and a vaginal occluder. The ring is sutured to the cervix with 0 Vicryl.  Trocar placement is decided. We infiltrate 2 cm above the umbilicus with 10 cc of ropivacaine per protocol and perform a 10 mm  incision which is brought down bluntly to the fascia. The fascia is identified and grasped with Coker forceps. The fascia is incised with Mayo scissors. Peritoneum is entered bluntly. A pursestring suture of 0 Vicryl is placed on the fascia and a 10 mm Hassan trocar is easily inserted in the  abdominal cavity held in placed with a Purstring suture. This allows for easy insufflation of a pneumoperitoneum using warmed CO2 at a maximum pressure of 15 mm of mercury. 60 cc of Ropivacaine 0.5 % diluted 1 in 1 is sent in the pelvis and the patient is positioned in reverse Trendelenburg. We then placed two 8mm robotic trocar on the left, one 8mm robotic trocar on the right and one 8 mm patient's side assistant trocar on the right  after infiltrating every site  with ropivacaine per protocol. The robot is docked on the right of the patient after positioning her in Trendelenburg. A Vessel Seal is inserted in arm #4, a Long bipolar forceps is inserted in arm #2 and a Prograsp is inserted in arm #1.  Preparation and docking is completed in 58 minutes.  Observation: Anterior and posterior cul-de-sac are normal. Both ovaries and tubes are normal. Uterus is enlarged with multiple fibroids with a volume of 14 weeks reaching the pelvic brim. Liver and appendix are normal. There are no adhesions in the pelvis. Both ureters are easily identified.  We start on the right side by sealing and cutting the mesosalpynx , the right utero-ovarian ligament and the right round ligament .  This gives [place] entry into the retroperitoneal space with an easy dissection of the anterior broad ligament. The ligament was opened all the way to the left round ligament.   We then proceed with systematic dissection of the bladder from the anterior vaginal cuff which is easily identified with the KOH ring. The plane of dissection is confirmed with filling the bladder with 200 cc of saline. We are able to  dissect the bladder 2 cm below the KOH ring. We then opened the posterior right broad ligament all the way to the posterior KOH ring after identifying the full course of the right ureter.   Moving to the left side we Seal and cut  the left round ligament , the left utero-ovarian ligament and  the mesosalpinx in between. Entry into the  retroperitoneal space allows [place] to complete dissection of the bladder on the left side and skeletonized the uterine vessels. The left broad ligament is then dissected all the way to the posterior KOH ring after identifying the full course of the left ureter.  Both tubes are removed from the pelvic cavity through the assistant's trocar.   With pressure on the KOH ring and the bladder fully dissected, we cauterize and cut the uterine vessels on both sides at the level of the KOH ring.  The vaginal occluder is inflated and we proceed with a 360 colpotomy using an open monopolar scissors and freeing the uterus entirely.  We then proceed with myomectomy removing the 3 largest posterior fibroid to allo vaginal delivery of the specimen.  The uterus is delivered vaginally with traction. The vaginal occluder is reinserted in the vagina to maintain pneumoperitoneum.  Instruments are then modified for a suture cut in arm #4 and a long tip forcep in arm #2. We proceed with closure of the vaginal cuff with 2 running sutures of 0 V-Lock. We irrigated profusely with warm saline and confirm a satisfactory hemostasis as well as 2 normal ureters with good mobility and no dilatation.  A sheet of Interceed , divided in 2, is placed on the vaginal cuff and behind the ovaries.  All instruments are then removed and the robot is undocked. Console time: 1 hours and 30 minutes including morcellation by myomectomy.  All trochars are removed under direct visualization after evacuating the pneumoperitoneum.  The fascia of the supraumbilical incision is closed with the previously placed pursestring suture of 0 Vicryl. All incisions are then closed with subcuticular suture of 3-0 Monocryl and Dermabond.  A speculum is inserted in the vagina to confirm a adequate closure of the vaginal cuff and good hemostasis.  Instrument and sponge count is complete x2. The procedure is well tolerated by the patient is taken to recovery  room in a well and stable condition.  Dr Estanislado Pandy was present and scrubbed at all times. Surgical assistance was required due to the complexity of the anatomy and the robotic approach of the procedure.   Estimated blood loss: 25 cc  Specimen: Uterus and tubes weighing 410 g sent to pathology

## 2022-09-22 NOTE — Discharge Instructions (Addendum)
Call [facility] at [phone] if:   You have a temperature greater than or equal to 100.4 degrees Farenheit orally You have pain that is not made better by the pain medication given and taken as directed You have excessive bleeding or problems urinating  Take Colace (Docusate Sodium/Stool Softener) 100 mg 2-3 times daily while taking narcotic pain medicine to avoid constipation or until bowel movements are regular. Take, with food, Ibuprofen 600 mg along with Acetaminophen 1000 mg  (#2-500 mg tablets) every 6 hours for 5 days then as needed for pain   You may drive after 2 weeks You may walk up steps  You may shower tomorrow-Keep dressings clean and dry until removed 3 days post-op You may resume a regular diet  Keep incisions clean and dry May remove dressings on Sunday, August 4th (3 days post-op) unless they become wet or soiled Do not lift over 15 pounds for 6 weeks Avoid anything in vagina for 6 weeks

## 2022-09-22 NOTE — Interval H&P Note (Signed)
History and Physical Interval Note:  09/22/2022 8:08 AM  Alexandra Hinton  has presented today for surgery, with the diagnosis of UTERINE FIBROIDS ENDOMETRIOSIS MENORHAGIA CHRONIC PELVIC PAIN.  The various methods of treatment have been discussed with the patient and family. After consideration of risks, benefits and other options for treatment, the patient has consented to  Procedure(s): XI ROBOTIC ASSISTED LAPAROSCOPIC HYSTERECTOMY AND SALPINGECTOMY; POSSIBLE BILATERAL OR UNILATERAL OOPHORECTOMY (Bilateral) as a surgical intervention.  The patient's history has been reviewed, patient examined, no change in status, stable for surgery.  I have reviewed the patient's chart and labs.  Questions were answered to the patient's satisfaction.     [name] [name]

## 2022-09-22 NOTE — Transfer of Care (Signed)
Immediate Anesthesia Transfer of Care Note  Patient: Alexandra Hinton  Procedure(s) Performed: XI ROBOTIC ASSISTED LAPAROSCOPIC HYSTERECTOMY with BILATERAL SALPINGECTOMY (Bilateral: Uterus)  Patient Location: PACU  Anesthesia Type:General  Level of Consciousness: awake, alert , oriented, and patient cooperative  Airway & Oxygen Therapy: Patient Spontanous Breathing  Post-op Assessment: Report given to RN and Post -op Vital signs reviewed and stable  Post vital signs: Reviewed and stable  Last Vitals:  Vitals Value Taken Time  BP 95/76 09/22/22 1202  Temp 36.2 C 09/22/22 1202  Pulse 83 09/22/22 1204  Resp 15 09/22/22 1204  SpO2 96 % 09/22/22 1204  Vitals shown include unfiled device data.  Last Pain:  Vitals:   09/22/22 0627  TempSrc: Oral  PainSc: 0-No pain      Patients Stated Pain Goal: 4 (09/22/22 2130)  Complications: No notable events documented.

## 2022-09-22 NOTE — Anesthesia Procedure Notes (Signed)
Procedure Name: Intubation Date/Time: 09/22/2022 8:23 AM  Performed by: Bishop Limbo, CRNAPre-anesthesia Checklist: Patient identified, Emergency Drugs available, Suction available and Patient being monitored Patient Re-evaluated:Patient Re-evaluated prior to induction Oxygen Delivery Method: Circle System Utilized Preoxygenation: Pre-oxygenation with 100% oxygen Induction Type: IV induction Ventilation: Mask ventilation without difficulty Laryngoscope Size: Mac and 3 Grade View: Grade I Tube type: Oral Number of attempts: 1 Airway Equipment and Method: Stylet Placement Confirmation: ETT inserted through vocal cords under direct vision, positive ETCO2 and breath sounds checked- equal and bilateral Secured at: 22 cm Tube secured with: Tape Dental Injury: Teeth and Oropharynx as per pre-operative assessment

## 2022-09-22 NOTE — Anesthesia Postprocedure Evaluation (Signed)
Anesthesia Post Note  Patient: Haig Prophet Silber  Procedure(s) Performed: XI ROBOTIC ASSISTED LAPAROSCOPIC HYSTERECTOMY with BILATERAL SALPINGECTOMY (Bilateral: Uterus)     Patient location during evaluation: PACU Anesthesia Type: General Level of consciousness: awake and alert Pain management: pain level controlled Vital Signs Assessment: post-procedure vital signs reviewed and stable Respiratory status: spontaneous breathing, nonlabored ventilation, respiratory function stable and patient connected to nasal cannula oxygen Cardiovascular status: blood pressure returned to baseline and stable Postop Assessment: no apparent nausea or vomiting Anesthetic complications: no   No notable events documented.  Last Vitals:  Vitals:   09/22/22 1202 09/22/22 1215  BP: 95/76 (!) 84/56  Pulse: 86 75  Resp: 12 15  Temp: (!) 36.2 C   SpO2: 98% 98%    Last Pain:  Vitals:   09/22/22 1202  TempSrc:   PainSc: 0-No pain                 Mariann Barter

## 2022-09-23 ENCOUNTER — Encounter (HOSPITAL_BASED_OUTPATIENT_CLINIC_OR_DEPARTMENT_OTHER): Payer: Self-pay | Admitting: Obstetrics and Gynecology

## 2023-04-20 ENCOUNTER — Telehealth: Payer: Self-pay | Admitting: Internal Medicine

## 2023-04-20 NOTE — Telephone Encounter (Signed)
 Pt reports walking to her car, after eating lunch, and experienced some chest discomfort, light headedness, clamminess & wheezing. She works at [employer] and had a Charity fundraiser take her BP, 120/80. Non-radiating pain relieved w/ rest. Advised pt she should make an overdue appt for follow up, go to ER if pain worsens/radiates. Informed that description sounds non cardiac, but to monitor and call back if need to re discuss. Patient verbalized understanding and agreeable to plan.

## 2023-04-20 NOTE — Telephone Encounter (Signed)
 Pt c/o of Chest Pain: STAT if CP now or developed within 24 hours  1. Are you having CP right now? No   2. Are you experiencing any other symptoms (ex. SOB, nausea, vomiting, sweating)? Sweating slightly during episode   3. How long have you been experiencing CP? Since an hour ago   4. Is your CP continuous or coming and going? Coming and going   5. Have you taken Nitroglycerin? No  ?

## 2023-05-05 ENCOUNTER — Other Ambulatory Visit: Payer: Self-pay | Admitting: Obstetrics and Gynecology

## 2023-05-05 DIAGNOSIS — Z1231 Encounter for screening mammogram for malignant neoplasm of breast: Secondary | ICD-10-CM

## 2023-05-31 ENCOUNTER — Ambulatory Visit: Admitting: Internal Medicine

## 2023-11-02 ENCOUNTER — Other Ambulatory Visit: Payer: Self-pay | Admitting: Obstetrics and Gynecology

## 2023-11-02 DIAGNOSIS — N6322 Unspecified lump in the left breast, upper inner quadrant: Secondary | ICD-10-CM

## 2023-11-03 ENCOUNTER — Ambulatory Visit
Admission: RE | Admit: 2023-11-03 | Discharge: 2023-11-03 | Disposition: A | Discharge status: Home / Self Care | Source: Ambulatory Visit | Attending: Obstetrics and Gynecology | Admitting: Obstetrics and Gynecology

## 2023-11-03 ENCOUNTER — Other Ambulatory Visit: Payer: Self-pay | Admitting: Obstetrics and Gynecology

## 2023-11-03 DIAGNOSIS — R0602 Shortness of breath: Secondary | ICD-10-CM

## 2023-11-07 ENCOUNTER — Ambulatory Visit
Admission: RE | Admit: 2023-11-07 | Discharge: 2023-11-07 | Disposition: A | Discharge status: Home / Self Care | Source: Ambulatory Visit | Attending: Obstetrics and Gynecology | Admitting: Obstetrics and Gynecology

## 2023-11-07 DIAGNOSIS — N6322 Unspecified lump in the left breast, upper inner quadrant: Secondary | ICD-10-CM

## 2023-11-08 ENCOUNTER — Ambulatory Visit: Payer: Self-pay

## 2023-11-08 NOTE — Telephone Encounter (Signed)
 FYI Only or Action Required?: FYI only for provider.  Patient was last seen in primary care on N/A, new patient.  Called Nurse Triage reporting Shortness of Breath.  Symptoms began several weeks ago.  Interventions attempted: Other: chest X ray, recent blood work, mammogram and breast ultrasound.  Symptoms are: intermittent mild SOB, headaches (mild), lightheaded intermittent (mild) unchanged.  Triage Disposition: See PCP Within 2 Weeks (overriding See HCP Within 4 Hours (Or PCP Triage))- Patient states she is currently seeing a provider for the symptoms, called in to re-establish with Dr Aletha at [facility].  Patient/caregiver understands and will follow disposition?: Yes               Copied from CRM [id number]. Topic: Clinical - Red Word Triage >> Nov 08, 2023  1:12 PM Alexandra Hinton wrote: [name] that prompted transfer to Nurse Triage: difficulty breathing, SOB- worsening Reason for Disposition  [1] MILD difficulty breathing (e.g., minimal/no SOB at rest, SOB with walking, pulse < 100) AND [2] NEW-onset or WORSE than normal  Answer Assessment - Initial Assessment Questions 1. RESPIRATORY STATUS: Describe your breathing? (e.g., wheezing, shortness of breath, unable to speak, severe coughing)      Shortness of breath comes up every once in a while. Felt a heaviness on her chest so she was checked out and had a chest x ray, showed left lower lobe scar tissue or something going on.  2. ONSET: When did this breathing problem begin?      Over the past 2 weeks.  3. PATTERN Does the difficult breathing come and go, or has it been constant since it started?      Comes and goes. She states it seems random. Sometimes while speaking and it will come on or during sleep.  4. SEVERITY: How bad is your breathing? (e.g., mild, moderate, severe)      No SOB at this time. Mild when it does come on. Describes it as a heavier breathing when it comes on.  5. RECURRENT SYMPTOM: Have you  had difficulty breathing before? If Yes, ask: When was the last time? and What happened that time?      She states in the past when she was  teenager she would get bronchitis, laryngitis, sinus infection and would have to go on steroids, antibiotics and an inhaler.   6. CARDIAC HISTORY: Do you have any history of heart disease? (e.g., heart attack, angina, bypass surgery, angioplasty)      No. She states she was seeing a cardiologist and had followed up with testing (2023).  7. LUNG HISTORY: Do you have any history of lung disease?  (e.g., pulmonary embolus, asthma, emphysema)     Reactive airway disease.  8. CAUSE: What do you think is causing the breathing problem?      Patient states she is currently having hormones tested due to perimenopause, feels like she has inflammation in her body. She also has left breast with cyst/extra tissue. She states yesterday she had diagnostic mammogram and ultrasound yesterday.  9. OTHER SYMPTOMS: Do you have any other symptoms? (e.g., chest pain, cough, dizziness, fever, runny nose)     Headaches (mild), states intermittent lightheaded (mild). Denies chest pain, numbness, weakness, fever.  10. O2 SATURATION MONITOR:  Do you use an oxygen saturation monitor (pulse oximeter) at home? If Yes, ask: What is your reading (oxygen level) today? What is your usual oxygen saturation reading? (e.g., 95%)       No.  11. PREGNANCY: Is there  any chance you are pregnant? When was your last menstrual period?       Perimenopause.  12. TRAVEL: Have you traveled out of the country in the last month? (e.g., travel history, exposures)       No.  Protocols used: Breathing Difficulty-A-AH

## 2024-03-11 ENCOUNTER — Ambulatory Visit: Admitting: Family Medicine

## 2024-03-11 ENCOUNTER — Encounter: Payer: Self-pay | Admitting: Family Medicine

## 2024-03-11 VITALS — BP 118/88 | HR 98 | Ht 64.0 in | Wt 176.0 lb

## 2024-03-11 DIAGNOSIS — R9389 Abnormal findings on diagnostic imaging of other specified body structures: Secondary | ICD-10-CM | POA: Diagnosis not present

## 2024-03-11 DIAGNOSIS — D72829 Elevated white blood cell count, unspecified: Secondary | ICD-10-CM

## 2024-03-11 DIAGNOSIS — E7800 Pure hypercholesterolemia, unspecified: Secondary | ICD-10-CM

## 2024-03-11 DIAGNOSIS — Z7689 Persons encountering health services in other specified circumstances: Secondary | ICD-10-CM

## 2024-03-11 NOTE — Progress Notes (Unsigned)
 "  Patient Office Visit  Assessment & Plan:  Encounter to establish care -     Hemoglobin A1c -     Comprehensive metabolic panel with GFR  Chest x-ray abnormality  Elevated cholesterol -     Lipid panel  Leukocytosis, unspecified type -     CBC with Differential/Platelet -     Comprehensive metabolic panel with GFR   Assessment and Plan    Establishing care New patient visit. No acute issues. - Continue routine health maintenance and screenings.  Abnormal chest imaging with mild dyspnea Intermittent mild dyspnea post-hysterectomy. Previous imaging showed possible atelectasis and scarring. No malignancy concern. - Consider repeat chest imaging if symptoms persist or worsen.  Hypercholesterolemia Previous elevated cholesterol levels. No recent testing. - Ordered cholesterol panel.  Leukocytosis Previous leukocytosis resolved. Mild dyspnea present. - Ordered CBC to monitor white blood cell count.      Test results were reviewed and analyzed as part of the medical decision making of this visit.  Reviewed CXR and previous labs during the office visit. Repeat labs today. Opted not to repeat CXR at this time since not having symptoms. RTC for CPE next time Recommend healthy diet i.e mediterranean/DASH diet, consistent exercise - 30 minutes 5 day per week, and gradual weight loss. Return if symptoms worsen or fail to improve, for physical.   Subjective:    Patient ID: Alexandra Hinton, female    DOB: Sep 07, 1975  Age: 49 y.o. MRN: [id number]  Chief Complaint  Patient presents with   Medical Management of Chronic Issues    Pt states that she had a chest xray and scar tissue was found on her left lower lung. She would like to discuss this due to having shortness of breath at times.   Establish Care    HPI Discussed the use of AI scribe software for clinical note transcription with the patient, who gave verbal consent to proceed.  History of Present Illness        History  of Present Illness Alexandra Hinton is a 49 year old female who presents for follow-up regarding hormone-related breast swelling and breathing issues. patient is here to establish primary care in our office. Patient would like to review previous CXR and other imaging previously ordered.   In September 2025, she began experiencing hormone-related breast swelling, with one breast becoming larger than the other and intermittent swelling. An ultrasound and mammogram were performed, which did not reveal any concerning findings. Blood work indicated she is in perimenopause with low free testosterone levels. She has not yet completed a salivary hormone test to assess estrogen levels.  She reports breathing issues that began after her hysterectomy on September 22, 2022. These issues are intermittent and include feeling out of breath more easily. She does not require an inhaler and has no exposure to tobacco or chemicals. She experienced a respiratory illness over Christmas, believed to be bronchitis, treated with an inhaler and Robitussin. She continues to have a dry cough. A chest x-ray was performed after the onset of breathing issues.  She exercises three days a week and works 38 hours a week. She is in a new relationship and has reconnected with an ex from 22 years ago. She had blood work done in September 2025, which showed elevated white blood cell count that normalized upon retesting. She is interested in checking her cholesterol and glucose levels.  Physical Exam CHEST: Lungs clear to auscultation bilaterally.  Results Labs CBC with differential (11/06/2023): Within  normal limits; previously, on 10/30/2023, white blood cell count and eosinophils were elevated Vitamin D (10/2023): Within normal limits TSH (10/2023): Within normal limits Ferritin (10/2023): Within normal limits Liver function panel (10/2023): Within normal limits Renal function panel (10/2023): Within normal limits Gonorrhea (2024):  Negative HIV (2024): Negative  Radiology Mammogram (10/2023): No abnormality in left breast or left axilla read per Dr. Jearld Breast ultrasound (10/2023): No abnormality in left breast or left axilla Chest X-ray: Possible atelectasis and scarring; findings not definitive Coronary artery calcium CT (2023): Coronary artery calcium score zero; visualized lungs unremarkable; possible hepatic steatosis Abdominal CT with contrast (2023): No acute intra-abdominal process; uterine leiomyoma; hepatic cysts or hemangioma; status post cholecystectomy  Assessment and Plan Establishing care New patient visit. No acute issues. - Continue routine health maintenance and screenings.  Abnormal chest imaging with mild dyspnea Intermittent mild dyspnea post-hysterectomy. Previous imaging showed possible atelectasis and scarring. No malignancy concern. - Consider repeat chest imaging if symptoms persist or worsen.  Hypercholesterolemia Previous elevated cholesterol levels. No recent testing. - Ordered cholesterol panel.  Leukocytosis Previous leukocytosis resolved. Mild dyspnea present. - Ordered CBC to monitor white blood cell count.  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 diet controlled   Other chest pain 2023   chest heaviness   Palpitations 2023   see 01/11/22 EKG in Epic, 02/10/22 Event monitor in Epic, Follows w/ Dr. Powell Sorrow, cardiology.   PONV (postoperative nausea and vomiting)    Rosacea    Past Surgical History:  Procedure Laterality Date   CHOLECYSTECTOMY  03/2019   LYSIS OF ADHESION N/A 09/13/2016   Procedure: LYSIS OF ADHESION, CAUTERIZATION OF ENDOMETRIOSIS;  Surgeon: Darcel Pool, MD;  Location: [facility];  Service: Gynecology;  Laterality: N/A;   ROBOT ASSISTED MYOMECTOMY N/A 09/13/2016   Procedure: ROBOTIC ASSISTED MYOMECTOMY with Morcellation;  Surgeon: Darcel Pool, MD;  Location: [facility];  Service: Gynecology;  Laterality: N/A;  power morcellation   ROBOTIC ASSISTED LAPAROSCOPIC HYSTERECTOMY AND SALPINGECTOMY Bilateral 09/22/2022   Procedure: XI ROBOTIC ASSISTED LAPAROSCOPIC HYSTERECTOMY with BILATERAL SALPINGECTOMY;  Surgeon: Darcel Pool, MD;  Location: [facility] ;  Service: Gynecology;  Laterality: Bilateral;   robotic myomectomy  12/2011   in [place], [place]   WISDOM TOOTH EXTRACTION     Social History[1] Family History  Problem Relation Age of Onset   Hyperlipidemia Mother    Stroke Mother    Hypothyroidism Mother    Anemia Mother  Hyperlipidemia Father    Multiple myeloma Father    Breast cancer Maternal Grandmother    Prostate cancer Maternal Grandfather    Leukemia Maternal Grandfather    Heart attack Maternal Grandfather    Ovarian cancer Paternal Grandmother    Breast cancer Paternal Grandmother    Heart attack Maternal Uncle    Breast cancer Maternal Aunt    Breast cancer Paternal Aunt    Allergies[2]  ROS    Objective:    BP 118/88   Pulse 98   Ht 5' 4 (1.626 m)   Wt 176 lb (79.8 kg)   LMP 04/26/2019   SpO2 95%   BMI 30.21 kg/m  BP Readings from Last 3 Encounters:  03/11/24 118/88  09/22/22 118/80  09/20/22 117/79   Wt Readings from Last 3  Encounters:  03/11/24 176 lb (79.8 kg)  09/22/22 170 lb 6.4 oz (77.3 kg)  09/20/22 165 lb (74.8 kg)    Physical Exam Vitals and nursing note reviewed.  Constitutional:      General: She is not in acute distress.    Appearance: Normal appearance.  HENT:     Head: Normocephalic.     Right Ear: Tympanic membrane, ear canal and external ear normal.     Left Ear: Tympanic membrane, ear canal and external ear normal.  Eyes:     Extraocular Movements: Extraocular movements intact.     Pupils: Pupils are equal, round, and reactive to light.  Cardiovascular:     Rate and Rhythm: Normal rate and regular rhythm.     Heart sounds: Normal heart sounds.  Pulmonary:     Effort: Pulmonary effort is normal.     Breath sounds: Normal breath sounds. No wheezing or rhonchi.  Musculoskeletal:     Right lower leg: No edema.     Left lower leg: No edema.  Neurological:     General: No focal deficit present.     Mental Status: She is alert and oriented to person, place, and time. Mental status is at baseline.  Psychiatric:        Mood and Affect: Mood normal.        Behavior: Behavior normal.        Thought Content: Thought content normal.        Judgment: Judgment normal.      Results for orders placed or performed in visit on 03/11/24  CBC with Differential/Platelet  Result Value Ref Range   WBC 10.0 3.8 - 10.8 Thousand/uL   RBC 4.70 3.80 - 5.10 Million/uL   Hemoglobin 14.8 11.7 - 15.5 g/dL   HCT 55.8 64.0 - 53.9 %   MCV 93.8 81.4 - 101.7 fL   MCH 31.5 27.0 - 33.0 pg   MCHC 33.6 31.6 - 35.4 g/dL   RDW 87.0 88.9 - 84.9 %   Platelets 314 140 - 400 Thousand/uL   MPV 10.6 7.5 - 12.5 fL   Neutro Abs 7,350 1,500 - 7,800 cells/uL   Absolute Lymphocytes 1,750 850 - 3,900 cells/uL   Absolute Monocytes 730 200 - 950 cells/uL   Eosinophils Absolute 130 15 - 500 cells/uL   Basophils Absolute 40 0 - 200 cells/uL   Neutrophils Relative % 73.5 %   Total Lymphocyte 17.5 %   Monocytes Relative  7.3 %   Eosinophils Relative 1.3 %   Basophils Relative 0.4 %  Lipid panel  Result Value Ref Range   Cholesterol 207 (H) <200 mg/dL   HDL 49 (L) > OR =  50 mg/dL   Triglycerides 783 (H) <150 mg/dL   LDL Cholesterol (Calc) 124 (H) mg/dL (calc)   Total CHOL/HDL Ratio 4.2 <5.0 (calc)   Non-HDL Cholesterol (Calc) 158 (H) <130 mg/dL (calc)  Hemoglobin J8r  Result Value Ref Range   Hgb A1c MFr Bld 5.4 <5.7 %   Mean Plasma Glucose 108 mg/dL   eAG (mmol/L) 6.0 mmol/L  Comprehensive metabolic panel with GFR  Result Value Ref Range   Glucose, Bld 92 65 - 99 mg/dL   BUN 12 7 - 25 mg/dL   Creat 9.20 9.49 - 9.00 mg/dL   eGFR 93 > OR = 60 fO/fpw/8.26f7   BUN/Creatinine Ratio SEE NOTE: 6 - 22 (calc)   Sodium 140 135 - 146 mmol/L   Potassium 4.6 3.5 - 5.3 mmol/L   Chloride 102 98 - 110 mmol/L   CO2 31 20 - 32 mmol/L   Calcium 9.5 8.6 - 10.2 mg/dL   Total Protein 6.9 6.1 - 8.1 g/dL   Albumin 4.5 3.6 - 5.1 g/dL   Globulin 2.4 1.9 - 3.7 g/dL (calc)   AG Ratio 1.9 1.0 - 2.5 (calc)   Total Bilirubin 0.4 0.2 - 1.2 mg/dL   Alkaline phosphatase (APISO) 54 31 - 125 U/L   AST 14 10 - 35 U/L   ALT 15 6 - 29 U/L             [1]  Social History Tobacco Use   Smoking status: Never   Smokeless tobacco: Never  Vaping Use   Vaping status: Never Used  Substance Use Topics   Alcohol use: Yes    Alcohol/week: 2.0 standard drinks of alcohol    Types: 2 Glasses of wine per week   Drug use: No  [2]  Allergies Allergen Reactions   Amoxicillin-Pot Clavulanate Hives and Shortness Of Breath    Has patient had a PCN reaction causing immediate rash, facial/tongue/throat swelling, SOB or lightheadedness with hypotension: Yes Has patient had a PCN reaction causing severe rash involving mucus membranes or skin necrosis: Yes Has patient had a PCN reaction that required hospitalization: No Has patient had a PCN reaction occurring within the last 10 years: No If all of the above answers are NO, then  may proceed with Cephalosporin use.    Cefaclor Anaphylaxis, Rash, Shortness Of Breath and Hives   Methimazole Hives, Anaphylaxis and Other (See Comments)   Other Rash   Sulfamethoxazole-Trimethoprim Hives and Shortness Of Breath   Tape Rash    derma bond allergy  PATIENT TOLERATES PAPER TAPER    Erythromycin Diarrhea and Nausea And Vomiting   Oxycodone-Acetaminophen  Other (See Comments)    Migraines and nightmares; per pt can take plain tylenol    Tramadol  Hives   Latex Rash   Wound Dressing Adhesive Dermatitis    Tolerates Paper Tape without problem  Same Reaction with DERMABOND   Wound Dressings Dermatitis    Tolerates Paper Tape without problem Same Reaction with DERMABOND   "

## 2024-03-12 ENCOUNTER — Ambulatory Visit: Payer: Self-pay | Admitting: Family Medicine

## 2024-03-12 LAB — CBC WITH DIFFERENTIAL/PLATELET
Absolute Lymphocytes: 1750 {cells}/uL (ref 850–3900)
Absolute Monocytes: 730 {cells}/uL (ref 200–950)
Basophils Absolute: 40 {cells}/uL (ref 0–200)
Basophils Relative: 0.4 %
Eosinophils Absolute: 130 {cells}/uL (ref 15–500)
Eosinophils Relative: 1.3 %
HCT: 44.1 % (ref 35.9–46.0)
Hemoglobin: 14.8 g/dL (ref 11.7–15.5)
MCH: 31.5 pg (ref 27.0–33.0)
MCHC: 33.6 g/dL (ref 31.6–35.4)
MCV: 93.8 fL (ref 81.4–101.7)
MPV: 10.6 fL (ref 7.5–12.5)
Monocytes Relative: 7.3 %
Neutro Abs: 7350 {cells}/uL (ref 1500–7800)
Neutrophils Relative %: 73.5 %
Platelets: 314 Thousand/uL (ref 140–400)
RBC: 4.7 Million/uL (ref 3.80–5.10)
RDW: 12.9 % (ref 11.0–15.0)
Total Lymphocyte: 17.5 %
WBC: 10 Thousand/uL (ref 3.8–10.8)

## 2024-03-12 LAB — COMPREHENSIVE METABOLIC PANEL WITH GFR
AG Ratio: 1.9 (calc) (ref 1.0–2.5)
ALT: 15 U/L (ref 6–29)
AST: 14 U/L (ref 10–35)
Albumin: 4.5 g/dL (ref 3.6–5.1)
Alkaline phosphatase (APISO): 54 U/L (ref 31–125)
BUN: 12 mg/dL (ref 7–25)
CO2: 31 mmol/L (ref 20–32)
Calcium: 9.5 mg/dL (ref 8.6–10.2)
Chloride: 102 mmol/L (ref 98–110)
Creat: 0.79 mg/dL (ref 0.50–0.99)
Globulin: 2.4 g/dL (ref 1.9–3.7)
Glucose, Bld: 92 mg/dL (ref 65–99)
Potassium: 4.6 mmol/L (ref 3.5–5.3)
Sodium: 140 mmol/L (ref 135–146)
Total Bilirubin: 0.4 mg/dL (ref 0.2–1.2)
Total Protein: 6.9 g/dL (ref 6.1–8.1)
eGFR: 93 mL/min/1.73m2

## 2024-03-12 LAB — HEMOGLOBIN A1C
Hgb A1c MFr Bld: 5.4 %
Mean Plasma Glucose: 108 mg/dL
eAG (mmol/L): 6 mmol/L

## 2024-03-12 LAB — LIPID PANEL
Cholesterol: 207 mg/dL — ABNORMAL HIGH
HDL: 49 mg/dL — ABNORMAL LOW
LDL Cholesterol (Calc): 124 mg/dL — ABNORMAL HIGH
Non-HDL Cholesterol (Calc): 158 mg/dL — ABNORMAL HIGH
Total CHOL/HDL Ratio: 4.2 (calc)
Triglycerides: 216 mg/dL — ABNORMAL HIGH
